# Patient Record
Sex: Male | Born: 1950 | Race: Black or African American | Hispanic: No | Marital: Married | Smoking: Never smoker
Health system: Southern US, Community
[De-identification: ages and names within clinical notes are randomized; demographics above are authoritative.]

## PROBLEM LIST (undated history)

## (undated) HISTORY — PX: CORONARY ANGIOPLASTY: SHX604

## (undated) HISTORY — PX: MULTIPLE TOOTH EXTRACTIONS: SHX2053

## (undated) HISTORY — PX: LUMBAR LAMINECTOMY: SHX95

## (undated) HISTORY — PX: RETINAL DETACHMENT SURGERY: SHX105

## (undated) HISTORY — PX: APPENDECTOMY: SHX54

## (undated) HISTORY — PX: ESOPHAGOGASTRODUODENOSCOPY: SHX1529

## (undated) HISTORY — PX: CATARACT EXTRACTION: SUR2

## (undated) HISTORY — PX: FEMORAL BYPASS: SHX50

## (undated) HISTORY — PX: COLONOSCOPY: SHX174

## (undated) HISTORY — PX: THULIUM LASER TURP (TRANSURETHRAL RESECTION OF PROSTATE): SHX6744

## (undated) HISTORY — PX: GASTRIC BYPASS: SHX52

## (undated) HISTORY — PX: LUMBAR FUSION: SHX111

---
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
## 1980-04-14 HISTORY — PX: CHOLECYSTECTOMY: SHX55

## 2001-10-26 HISTORY — PX: CORONARY ANGIOPLASTY WITH STENT PLACEMENT: SHX49

## 2002-04-05 HISTORY — PX: CORONARY ANGIOPLASTY: SHX604

## 2003-03-23 HISTORY — PX: CORONARY ANGIOPLASTY: SHX604

## 2007-01-07 HISTORY — PX: CORONARY ANGIOPLASTY WITH STENT PLACEMENT: SHX49

## 2018-10-13 HISTORY — PX: BACK SURGERY: SHX140

## 2021-04-01 ENCOUNTER — Other Ambulatory Visit: Payer: Self-pay | Admitting: Neurosurgery

## 2021-04-01 DIAGNOSIS — Z01818 Encounter for other preprocedural examination: Secondary | ICD-10-CM

## 2021-04-04 ENCOUNTER — Other Ambulatory Visit: Payer: Self-pay

## 2021-04-04 ENCOUNTER — Encounter
Admission: RE | Admit: 2021-04-04 | Discharge: 2021-04-04 | Disposition: A | Discharge status: Home / Self Care | Payer: Medicare PPO | Source: Ambulatory Visit | Attending: Neurosurgery | Admitting: Neurosurgery

## 2021-04-04 ENCOUNTER — Encounter (HOSPITAL_COMMUNITY): Payer: Self-pay | Admitting: Urgent Care

## 2021-04-04 VITALS — BP 121/72 | HR 70 | Resp 16 | Ht 68.5 in | Wt 185.2 lb

## 2021-04-04 DIAGNOSIS — Z01818 Encounter for other preprocedural examination: Secondary | ICD-10-CM | POA: Diagnosis not present

## 2021-04-04 DIAGNOSIS — I2510 Atherosclerotic heart disease of native coronary artery without angina pectoris: Secondary | ICD-10-CM | POA: Diagnosis not present

## 2021-04-04 DIAGNOSIS — Z01810 Encounter for preprocedural cardiovascular examination: Secondary | ICD-10-CM | POA: Diagnosis not present

## 2021-04-04 DIAGNOSIS — Z79899 Other long term (current) drug therapy: Secondary | ICD-10-CM | POA: Diagnosis not present

## 2021-04-04 DIAGNOSIS — I10 Essential (primary) hypertension: Secondary | ICD-10-CM | POA: Insufficient documentation

## 2021-04-04 DIAGNOSIS — E119 Type 2 diabetes mellitus without complications: Secondary | ICD-10-CM

## 2021-04-04 DIAGNOSIS — I252 Old myocardial infarction: Secondary | ICD-10-CM | POA: Insufficient documentation

## 2021-04-04 LAB — CBC
HCT: 37 % — ABNORMAL LOW (ref 39.0–52.0)
Hemoglobin: 11.3 g/dL — ABNORMAL LOW (ref 13.0–17.0)
MCH: 24.1 pg — ABNORMAL LOW (ref 26.0–34.0)
MCHC: 30.5 g/dL (ref 30.0–36.0)
MCV: 78.9 fL — ABNORMAL LOW (ref 80.0–100.0)
Platelets: 295 10*3/uL (ref 150–400)
RBC: 4.69 MIL/uL (ref 4.22–5.81)
RDW: 17 % — ABNORMAL HIGH (ref 11.5–15.5)
WBC: 12.3 10*3/uL — ABNORMAL HIGH (ref 4.0–10.5)
nRBC: 0 % (ref 0.0–0.2)

## 2021-04-04 LAB — URINALYSIS, ROUTINE W REFLEX MICROSCOPIC
Bilirubin Urine: NEGATIVE
Glucose, UA: >1000 mg/dL — AB
Ketones, ur: NEGATIVE mg/dL
Leukocytes,Ua: NEGATIVE
Nitrite: NEGATIVE
Protein, ur: NEGATIVE mg/dL
Specific Gravity, Urine: 1.02 (ref 1.005–1.030)
pH: 5.5 (ref 5.0–8.0)

## 2021-04-04 LAB — URINALYSIS, MICROSCOPIC (REFLEX)

## 2021-04-04 LAB — BASIC METABOLIC PANEL
Anion gap: 5 (ref 5–15)
BUN: 19 mg/dL (ref 8–23)
CO2: 27 mmol/L (ref 22–32)
Calcium: 9.1 mg/dL (ref 8.9–10.3)
Chloride: 108 mmol/L (ref 98–111)
Creatinine, Ser: 1.06 mg/dL (ref 0.61–1.24)
GFR, Estimated: >60 mL/min (ref >60)
Glucose, Bld: 90 mg/dL (ref 70–99)
Potassium: 4.1 mmol/L (ref 3.5–5.1)
Sodium: 140 mmol/L (ref 135–145)

## 2021-04-04 LAB — TYPE AND SCREEN
ABO/RH(D): B POS
Antibody Screen: NEGATIVE

## 2021-04-04 LAB — PROTIME-INR
INR: 1 (ref 0.8–1.2)
Prothrombin Time: 12.8 seconds (ref 11.4–15.2)

## 2021-04-04 LAB — APTT: aPTT: 27 seconds (ref 24–36)

## 2021-04-04 LAB — SURGICAL PCR SCREEN
MRSA, PCR: NEGATIVE
Staphylococcus aureus: NEGATIVE

## 2021-04-04 NOTE — Patient Instructions (Addendum)
Your procedure is scheduled on:04-17-21 Wednesday Report to the Registration Desk on the 1st floor of the Medical Mall.Then proceed to the 2nd floor Surgery Desk in the Medical Mall To find out your arrival time, please call [phone] between 1PM - 3PM on:04-16-21 Tuesday  REMEMBER: Instructions that are not followed completely may result in serious medical risk, up to and including death; or upon the discretion of your surgeon and anesthesiologist your surgery may need to be rescheduled.  Do not eat food after midnight the night before surgery.  No gum chewing, lozengers or hard candies.  You may however, drink Water up to 2 hours before you are scheduled to arrive for your surgery. Do not drink anything within 2 hours of your scheduled arrival time.  Type 1 and Type 2 diabetics should only drink water.  TAKE THESE MEDICATIONS THE MORNING OF SURGERY WITH A SIP OF WATER: -buPROPion (WELLBUTRIN XL) -carvedilol (COREG) -gabapentin (NEURONTIN) -mirabegron ER (MYRBETRIQ)  -pantoprazole (PROTONIX)  Stop your clopidogrel (PLAVIX) 7 days prior to surgery as instructed by Dr Jacki Cones dose on 04-09-21 Tuesday  Continue your Aspirin up until the day prior to surgery-Do NOT take the morning of surgery  Stop your empagliflozin (JARDIANCE) 3 days prior to surgery-Last dose on 04-13-21 Saturday  One week prior to surgery: Stop Anti-inflammatories (NSAIDS) such as Advil, Aleve, Ibuprofen, Motrin, Naproxen, Naprosyn and Aspirin based products such as Excedrin, Goodys Powder, BC Powder. You may however, continue to take Tylenol if needed for pain up until the day of surgery.  Stop ANY OVER THE COUNTER supplements/vitamins 7 days prior to surgery (Multiple Vitamin ,Probiotic Product )  No Alcohol for 24 hours before or after surgery.  No Smoking including e-cigarettes for 24 hours prior to surgery.  No chewable tobacco products for at least 6 hours prior to surgery.  No nicotine patches on  the day of surgery.  Do not use any "recreational" drugs for at least a week prior to your surgery.  Please be advised that the combination of cocaine and anesthesia may have negative outcomes, up to and including death. If you test positive for cocaine, your surgery will be cancelled.  On the morning of surgery brush your teeth with toothpaste and water, you may rinse your mouth with mouthwash if you wish. Do not swallow any toothpaste or mouthwash.  Use CHG Soap as directed on instruction sheet.  Do not wear jewelry, make-up, hairpins, clips or nail polish.  Do not wear lotions, powders, or perfumes.   Do not shave body from the neck down 48 hours prior to surgery just in case you cut yourself which could leave a site for infection.  Also, freshly shaved skin may become irritated if using the CHG soap.  Contact lenses, hearing aids and dentures may not be worn into surgery.  Do not bring valuables to the hospital. [facility] is not responsible for any missing/lost belongings or valuables.   Notify your doctor if there is any change in your medical condition (cold, fever, infection).  Wear comfortable clothing (specific to your surgery type) to the hospital.  After surgery, you can help prevent lung complications by doing breathing exercises.  Take deep breaths and cough every 1-2 hours. Your doctor may order a device called an Incentive Spirometer to help you take deep breaths. When coughing or sneezing, hold a pillow firmly against your incision with both hands. This is called splinting. Doing this helps protect your incision. It also decreases belly discomfort.  If  you are being admitted to the hospital overnight, leave your suitcase in the car. After surgery it may be brought to your room.  If you are being discharged the day of surgery, you will not be allowed to drive home. You will need a responsible adult (18 years or older) to drive you home and stay with you that  night.   If you are taking public transportation, you will need to have a responsible adult (18 years or older) with you. Please confirm with your physician that it is acceptable to use public transportation.   Please call the Pre-admissions Testing Dept. at [phone] if you have any questions about these instructions.  Surgery Visitation Policy:  Patients undergoing a surgery or procedure may have one family member or support person with them as long as that person is not COVID-19 positive or experiencing its symptoms.  That person may remain in the waiting area during the procedure and may rotate out with other people.  Inpatient Visitation:    Visiting hours are 7 a.m. to 8 p.m. Up to two visitors ages 16+ are allowed at one time in a patient room. The visitors may rotate out with other people during the day. Visitors must check out when they leave, or other visitors will not be allowed. One designated support person may remain overnight. The visitor must pass COVID-19 screenings, use hand sanitizer when entering and exiting the patients room and wear a mask at all times, including in the patients room. Patients must also wear a mask when staff or their visitor are in the room. Masking is required regardless of vaccination status.

## 2021-04-05 LAB — HEMOGLOBIN A1C
Hgb A1c MFr Bld: 6.7 % — ABNORMAL HIGH (ref 4.8–5.6)
Mean Plasma Glucose: 146 mg/dL

## 2021-04-09 ENCOUNTER — Encounter: Payer: Self-pay | Admitting: Neurosurgery

## 2021-04-09 NOTE — Progress Notes (Signed)
Perioperative Services  Pre-Admission/Anesthesia Testing Clinical Review  Date: 04/09/21  Patient Demographics:  Name: Chad Sanders. 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 Clinical nursing staff has updated patient's PMH/PSHx, current medication list, and drug allergies/intolerances to ensure comprehensive history available to assist in medical decision making as it pertains to the aforementioned surgical procedure and anticipated anesthetic course. Extensive review of available clinical information performed. Chad Sanders PMH and PSHx updated with any diagnoses/procedures that  may have been inadvertently omitted during his intake with the pre-admission testing department's nursing staff.  Clinical Discussion:  Chad Dion. is a 70 y.o. male who is submitted for pre-surgical anesthesia review and clearance prior to him undergoing the above procedure. Patient has never been a smoker. Pertinent PMH includes: CAD, STEMI, PAD, HTN, HLD, T2DM, GERD (on daily PPI), OSAH (does not require nocturnal PAP therapy), anemia, OA, spinal stenosis, thoracolumbar kyphosis, previous lumbar fusion.  Patient is followed by cardiology Chad Shutter, MD). He was last seen in the cardiology clinic on 03/14/2021; notes reviewed.  At the time of his clinic visit, patient doing well overall from a cardiovascular perspective.  He denied any episodes of chest pain, short of breath, PND, orthopnea, palpitations, significant peripheral edema, vertiginous symptoms, or  presyncope/syncope.  Patient did complain of worsening claudication pain in his LEFT lower extremity.  PMH significant for cardiovascular diagnoses.  Patient suffered an inferior wall STEMI on 10/26/2001.  Diagnostic left heart catheterization performed revealing multivessel CAD; 75% mid LCx,, 25% mid LAD-1, 50% mid LAD-2, 50% distal LAD-1, 50% distal LAD-2.  PCI was performed placing a 3.0 x 12 mm Express stent to his mid LCx resulting in TIMI-3 flow.  Patient underwent diagnostic left heart catheterization on 04/05/2002 revealing multivessel CAD; <25% distal RCA, <25% proximal LCx, 50% mid LCx, 25% OM2, less than 25% ramus intermedius, <25% proximal LAD, 50% mid LAD, <25% mid LAD, 50% distal LAD, and 25% D2.  There was 50% ISR noted within the previously placed mid LCx stent.  PTCA was performed.  Patient underwent diagnostic left heart catheterization on 03/23/2003 revealing multivessel CAD; 25% distal RCA, <25% RPL1, 50% mid LCx, 25% OM2, 50% proximal LAD, 25% mid LAD-1, 50% mid LAD-2, and 25% distal LAD.  PTCA was performed.  Patient underwent repeat diagnostic left heart catheterization on 01/07/2007.  EF 60%.  There was 60% stenosis noted within the mid LAD.  PCI performed placing a 2.75 x 20 mm Taxus DES to the mid LAD resulting in TIMI-3 flow.  Last TTE was performed on 04/05/2000 revealing normal left ventricular systolic function with mild LVH; LVEF 50%.  There was trivial TR and PR; no AR/MR.  There was no significant transvalvular gradient to suggest stenosis.  Given PVD and past stent placement, patient is on daily DAPT therapy (ASA + clopidogrel); compliant with therapy with no evidence or reports of GI bleeding.  Blood pressure well controlled at 100/66 on currently prescribed beta-blocker, CCB, and ARB therapies.  Patient is on a statin for his HLD.  T2DM well-controlled on currently prescribed regimen; last Hgb A1c was 6.7% when checked on 04/04/2021.  Patient is on an SGLT2 I in the  setting of known cardiac disease and concurrent T2DM. Functional capacity, as defined by DASI, is documented as being >/= 4 METS.  No changes were made to his medication regimen.  Patient to follow-up with outpatient cardiology and 3 months or sooner if needed.  Chad Bloodgood Sr. is scheduled for an C3-4 ANTERIOR CERVICAL DECOMPRESSION/DISCECTOMY FUSION 1 LEVEL on 04/17/2021 with Dr. Venetia Night, MD.  Given patient's past medical history significant for cardiovascular diagnoses, presurgical cardiac clearance was sought by the PAT team. Per cardiology, "this patient is optimized for surgery and may proceed with the planned procedural course with a LOW risk of significant perioperative cardiovascular complications". Again, this patient is on daily DAPT therapy.  Cardiology has cleared patient to stop his clopidogrel for 5 days prior to his procedure with plans to restart as soon as postoperatively respectively minimized by his primary attending surgeon.  He has been asked to continue his daily low-dose ASA throughout the perioperative period.  He is aware that his last dose of clopidogrel will be on 04/11/2021.  Patient denies previous perioperative complications with anesthesia in the past. In review of the available records, it is noted that patient underwent a general anesthetic course at [facility] (ASA III) in 10/2018 without documented complications.   Vitals with BMI 04/04/2021  Height 5' 8.5"  Weight 185 lbs 3 oz  BMI 27.75  Systolic 121  Diastolic 72  Pulse 70    Providers/Specialists:   NOTE: Primary physician provider listed below. Patient may have been seen by APP or partner within same practice.   PROVIDER ROLE / SPECIALTY LAST Donalynn Furlong, MD Neurosurgery 03/23/2021  Ferne Coe, MD Primary Care Provider  ???  Youlanda Mighty, MD Cardiology 03/14/2021   Allergies:  Ace inhibitors, Atorvastatin, Contrast media [iodinated contrast media],  Demerol [meperidine hcl], Dilaudid [hydromorphone], Latex, Morphine and related, Nsaids, Silicone, Tape, and Vytorin [ezetimibe-simvastatin]  Current Home Medications:   No current facility-administered medications for this encounter.    acetaminophen (TYLENOL) 500 MG tablet   aspirin EC 81 MG tablet   buPROPion (WELLBUTRIN XL) 150 MG 24 hr tablet   carvedilol (COREG) 25 MG tablet   clopidogrel (PLAVIX) 75 MG tablet   Doxepin HCl 6 MG TABS   empagliflozin (JARDIANCE) 25 MG TABS tablet   felodipine (PLENDIL) 10 MG 24 hr tablet   furosemide (LASIX) 20 MG tablet   gabapentin (NEURONTIN) 600 MG tablet   hydrOXYzine (ATARAX) 25 MG tablet   loperamide (IMODIUM A-D) 2 MG tablet   mirabegron ER (MYRBETRIQ) 50 MG TB24 tablet   pantoprazole (PROTONIX) 40 MG tablet   potassium chloride SA (KLOR-CON M) 20 MEQ tablet   pravastatin (PRAVACHOL) 40 MG tablet   Probiotic Product (DIGESTIVE ADVANTAGE) CAPS   sodium fluoride (PREVIDENT 5000 PLUS) 1.1 % CREA dental cream   tamsulosin (FLOMAX) 0.4 MG CAPS capsule   telmisartan (MICARDIS) 80 MG tablet   calcium carbonate (TUMS - DOSED IN MG ELEMENTAL CALCIUM) 500 MG chewable tablet   Multiple Vitamins-Minerals (MULTIVITAMIN MEN 50+ PO)   Probiotic Product (DIGESTIVE ADVANTAGE GUMMIES PO)   sitaGLIPtin (JANUVIA) 50 MG tablet   History:   Past Medical History:  Diagnosis Date   Anemia    Arthritis    Coronary artery disease 10/26/2001   a.) STEMI 10/26/2001. LHC --> 75% mLCx, 25% mLAD, 50%  mLAD, 50% dLAD; 3.0 x 12 mm Exress stent to mLCx. b.) LHC 04/05/2002: 50% mLCx, 50% mLAD, 50% dLAD, 50% ISR of mLCX stent; PCTA performed. c.) LHC 03/23/2003: 50% mLCx, 50% pLAD, 50% mLAD; PTCA performed. d.) LHC 01/07/2007: 60% mLAD; PCI placing a 2.75 x 20 mm Taxus DES to mLAD.   Erectile dysfunction    GERD (gastroesophageal reflux disease)    H/O heart artery stent 10/26/2001   a.) 10/26/2001: 3.0 x 12 mm Express stent to mLCx. b.) 01/07/2007: 2.75 x 20 mm  Taxus DES to mLAD.   High cholesterol    History of lumbar fusion    Hypertension    Long term current use of antithrombotics/antiplatelets    a.) DAPT therapy (ASA + clopidogrel)   OAB (overactive bladder)    PAD (peripheral artery disease) (HCC)    Prostate cancer (HCC)    a.) Tx'd with chemotherapy + XRT   S/P gastric bypass    Sleep apnea    a.) no nocturnal PAP therapy following post-surgical (RNY) weight loss   Spinal stenosis    ST elevation myocardial infarction (STEMI) of inferior wall (HCC) 10/26/2001   a.) LHC 10/26/2001: EF 55%; 75% mLCx, 25% mLAD, 50% mLAD, 50% dLAD; PCI performed placing a 3.0 x 12 mm Extress stent to mLCx.   T2DM (type 2 diabetes mellitus) (HCC)    Thoracolumbar kyphosis    Past Surgical History:  Procedure Laterality Date   APPENDECTOMY     child   CATARACT EXTRACTION Bilateral    CHOLECYSTECTOMY  1982   COLONOSCOPY     CORONARY ANGIOPLASTY     CORONARY ANGIOPLASTY Left 04/05/2002   Procedure: CORONARY ANGIOPLASTY WITH PTCA; Location: [facility]; Surgeon: Cephas Darby, MD   CORONARY ANGIOPLASTY Left 03/23/2003   Procedure: CORONARY ANGIOPLASTY WITH PTCA; Location: [facility]; Surgeon: Cephas Darby, MD   CORONARY ANGIOPLASTY WITH STENT PLACEMENT Left 10/26/2001   Procedure: CORONARY ANGIOPLASTY WITH STENT PLACEMENT (3.0 x 12 mm Express stent to mLCx); Location: [facility]; Surgeon: Jerrell Mylar, MD   CORONARY ANGIOPLASTY WITH STENT PLACEMENT Left 01/07/2007   Procedure: CORONARY ANGIOPLASTY WITH STENT PLACEMENT (2.75 x 20 mm Taxus DES to mLAD); Location: [facility]; Surgeon: Rolly Salter, MD   ESOPHAGOGASTRODUODENOSCOPY     FEMORAL BYPASS Left    FRACTURE SURGERY Right 1966   side of skull   GASTRIC BYPASS     LUMBAR FUSION     LUMBAR LAMINECTOMY     muliple lumbar surgeries   MULTIPLE TOOTH EXTRACTIONS     RETINAL DETACHMENT SURGERY Right    THULIUM LASER TURP (TRANSURETHRAL RESECTION OF PROSTATE)     No family history on file. Social History   Tobacco Use    Smoking status: Never   Smokeless tobacco: Never  Substance Use Topics   Alcohol use: Not Currently    Comment: if so its very rare   Drug use: Never    Pertinent Clinical Results:  LABS: Labs reviewed: Acceptable for surgery.  No visits with results within 3 Day(s) from this visit.  Latest known visit with results is:  Hospital Outpatient Visit on 04/04/2021  Component Date Value Ref Range Status   aPTT 04/04/2021 27  24 - 36 seconds Final   Performed at [facility], [address] [place], [place] [address]   Sodium 04/04/2021 140  135 - 145 mmol/L Final   Potassium 04/04/2021 4.1  3.5 - 5.1 mmol/L Final   Chloride 04/04/2021 108  98 - 111 mmol/L Final   CO2  04/04/2021 27  22 - 32 mmol/L Final   Glucose, Bld 04/04/2021 90  70 - 99 mg/dL Final   Glucose reference range applies only to samples taken after fasting for at least 8 hours.   BUN 04/04/2021 19  8 - 23 mg/dL Final   Creatinine, Ser 04/04/2021 1.06  0.61 - 1.24 mg/dL Final   Calcium 24/40/1027 9.1  8.9 - 10.3 mg/dL Final   GFR, Estimated 04/04/2021 >60  >60 mL/min Final   Comment: (NOTE) Calculated using the CKD-EPI Creatinine Equation (2021)    Anion gap 04/04/2021 5  5 - 15 Final   Performed at [facility], [address] [place], [place] [address]   WBC 04/04/2021 12.3 (H)  4.0 - 10.5 K/uL Final   RBC 04/04/2021 4.69  4.22 - 5.81 MIL/uL Final   Hemoglobin 04/04/2021 11.3 (L)  13.0 - 17.0 g/dL Final   HCT 44/06/4740 37.0 (L)  39.0 - 52.0 % Final   MCV 04/04/2021 78.9 (L)  80.0 - 100.0 fL Final   MCH 04/04/2021 24.1 (L)  26.0 - 34.0 pg Final   MCHC 04/04/2021 30.5  30.0 - 36.0 g/dL Final   RDW 59/56/3875 17.0 (H)  11.5 - 15.5 % Final   Platelets 04/04/2021 295  150 - 400 K/uL Final   nRBC 04/04/2021 0.0  0.0 - 0.2 % Final   Performed at [facility], [address] [place], [place] [address]   MRSA, PCR 04/04/2021 NEGATIVE  NEGATIVE Final   Staphylococcus aureus 04/04/2021  NEGATIVE  NEGATIVE Final   Comment: (NOTE) The Xpert SA Assay (FDA approved for NASAL specimens in patients 1 years of age and older), is one component of a comprehensive surveillance program. It is not intended to diagnose infection nor to guide or monitor treatment. Performed at [facility], [address] [place], [place] [address]   ABO/RH(D) 04/04/2021 B POS   Final   Antibody Screen 04/04/2021 NEG   Final   Sample Expiration 04/04/2021 04/18/2021,2359   Final   Extend sample reason 04/04/2021    Final                   Value:NO TRANSFUSIONS OR PREGNANCY IN THE PAST 3 MONTHS Performed at [facility], [address] [place], [place] [address]   Color, Urine 04/04/2021 YELLOW  YELLOW Final   APPearance 04/04/2021 CLEAR  CLEAR Final   Specific Gravity, Urine 04/04/2021 1.020  1.005 - 1.030 Final   pH 04/04/2021 5.5  5.0 - 8.0 Final   Glucose, UA 04/04/2021 >1,000 (A)  NEGATIVE mg/dL Final   Hgb urine dipstick 04/04/2021 TRACE (A)  NEGATIVE Final   Bilirubin Urine 04/04/2021 NEGATIVE  NEGATIVE Final   Ketones, ur 04/04/2021 NEGATIVE  NEGATIVE mg/dL Final   Protein, ur 63/04/6008 NEGATIVE  NEGATIVE mg/dL Final   Nitrite 93/23/5573 NEGATIVE  NEGATIVE Final   Leukocytes,Ua 04/04/2021 NEGATIVE  NEGATIVE Final   Performed at [facility], [address] [place], [place] [address]   Prothrombin Time 04/04/2021 12.8  11.4 - 15.2 seconds Final   INR 04/04/2021 1.0  0.8 - 1.2 Final   Comment: (NOTE) INR goal varies based on device and disease states. Performed at [facility], [address] [place], [place] [address]   Hgb A1c MFr Bld 04/04/2021 6.7 (H)  4.8 - 5.6 % Final   Comment: (NOTE)         Prediabetes: 5.7 - 6.4  Diabetes: >6.4         Glycemic control for adults with diabetes: <7.0   Mean Plasma Glucose 04/04/2021 146  mg/dL Final   Comment: (NOTE) Performed At: [facility] [address] [place], [place]  [id number] Jolene Schimke MD WU:[id number]   RBC / HPF 04/04/2021 0-5  0 - 5 RBC/hpf Final   WBC, UA 04/04/2021 0-5  0 - 5 WBC/hpf Final   Bacteria, UA 04/04/2021 RARE (A)  NONE SEEN Final   Squamous Epithelial / LPF 04/04/2021 0-5  0 - 5 Final   Performed at [facility], [address] [place], [place] [address]    ECG: Date: 04/04/2021 Time ECG obtained: 1401 PM Rate: 79 bpm Rhythm: normal sinus Axis (leads I and aVF): Normal Intervals: PR 184 ms. QRS 80 ms. QTc 399 ms. ST segment and T wave changes: No evidence of acute ST segment elevation or depression Comparison: Similar to previous tracing obtained on 08/31/2020   IMAGING / PROCEDURES: MRI TOTAL TIME INCLUDING C/T/L SPINE WITH/WITHOUT CONTRAST performed on 03/25/2021 Disc bulge, facet arthropathy and ligamentum flavum buckling at T8-T9 results in severe spinal canal stenosis and severe bilateral neuroforaminal narrowing.  Spondylosis of the cervical spine most pronounced at C3-C4 where there is uncovertebral hypertrophy and small central disc protrusion resulting in moderate spinal canal stenosis.   TRANSTHORACIC ECHOCARDIOGRAM performed on 04/05/2020 LVEF 50% Normal left ventricular systolic function with mild LVH Normal right ventricular systolic function Trivial TR and PR No AR or MR No valvular stenosis No pericardial effusion  Impression and Plan:  Chad Bloodgood Sr. has been referred for pre-anesthesia review and clearance prior to him undergoing the planned anesthetic and procedural courses. Available labs, pertinent testing, and imaging results were personally reviewed by me. This patient has been appropriately cleared by cardiology with an overall LOW risk of significant perioperative cardiovascular complications.  Based on clinical review performed today (04/09/21), barring any significant acute changes in the patient's overall condition, it is anticipated that he will be able to proceed with the  planned surgical intervention. Any acute changes in clinical condition may necessitate his procedure being postponed and/or cancelled. Patient will meet with anesthesia team (MD and/or CRNA) on the day of his procedure for preoperative evaluation/assessment. Questions regarding anesthetic course will be fielded at that time.   Pre-surgical instructions were reviewed with the patient during his PAT appointment and questions were fielded by PAT clinical staff. Patient was advised that if any questions or concerns arise prior to his procedure then he should return a call to PAT and/or his surgeon's office to discuss.  Quentin Mulling, MSN, APRN, FNP-C, CEN [facility]  Peri-operative Services Nurse Practitioner Phone: [phone] Fax: [phone] 04/09/21 1:05 PM  NOTE: This note has been prepared using Dragon dictation software. Despite my best ability to proofread, there is always the potential that unintentional transcriptional errors may still occur from this process.

## 2021-04-11 NOTE — Progress Notes (Signed)
I don't understand all these coding queries.  This is a standard preop lab.  [employer]

## 2021-04-17 ENCOUNTER — Ambulatory Visit: Admission: RE | Admit: 2021-04-17 | Payer: Medicare PPO | Source: Home / Self Care | Admitting: Neurosurgery

## 2021-04-17 ENCOUNTER — Encounter: Admission: RE | Payer: Self-pay | Source: Home / Self Care

## 2021-04-17 SURGERY — ANTERIOR CERVICAL DECOMPRESSION/DISCECTOMY FUSION 1 LEVEL
Anesthesia: General

## 2021-04-29 ENCOUNTER — Other Ambulatory Visit: Payer: Self-pay | Admitting: Neurosurgery

## 2021-04-29 DIAGNOSIS — Z01818 Encounter for other preprocedural examination: Secondary | ICD-10-CM

## 2021-05-20 ENCOUNTER — Other Ambulatory Visit: Payer: Self-pay

## 2021-05-20 ENCOUNTER — Other Ambulatory Visit
Admission: RE | Admit: 2021-05-20 | Discharge: 2021-05-20 | Disposition: A | Discharge status: Home / Self Care | Payer: Medicare PPO | Source: Ambulatory Visit | Attending: Neurosurgery | Admitting: Neurosurgery

## 2021-05-20 NOTE — Patient Instructions (Signed)
Your procedure is scheduled on: Wednesday May 29, 2021. Report to Day Surgery inside Medical 2nd floor,  To find out your arrival time please call [phone] between 1PM - 3PM on .  Remember: Instructions that are not followed completely may result in serious medical risk,  up to and including death, or upon the discretion of your surgeon and anesthesiologist your  surgery may need to be rescheduled.     _X__ 1. Do not eat food after midnight the night before your procedure.                 No chewing gum or hard candies. You may drink clear liquids up to 2 hours                 before you are scheduled to arrive for your surgery- DO not drink clear                 liquids within 2 hours of the start of your surgery.                 Clear Liquids include:  water.  __X__2.  On the morning of surgery brush your teeth with toothpaste and water, you                may rinse your mouth with mouthwash if you wish.  Do not swallow any toothpaste or mouthwash.     _X__ 3.  No Alcohol for 24 hours before or after surgery.   _X__ 4.  Do Not Smoke or use e-cigarettes For 24 Hours Prior to Your Surgery.                 Do not use any chewable tobacco products for at least 6 hours prior to                 Surgery.  _X__  5.  Do not use any recreational drugs (marijuana, cocaine, heroin, ecstasy, MDMA or other)                For at least one week prior to your surgery.  Combination of these drugs with anesthesia                May have life threatening results.  ____  6.  Bring all medications with you on the day of surgery if instructed.   __X__  7.  Notify your doctor if there is any change in your medical condition      (cold, fever, infections).     Do not wear jewelry, make-up, hairpins, clips or nail polish. Do not wear lotions, powders, or perfumes. You may wear deodorant. Do not shave 48 hours prior to surgery. Men may shave face and neck. Do not bring  valuables to the hospital.    [facility] is not responsible for any belongings or valuables.  Contacts, dentures or bridgework may not be worn into surgery. Leave your suitcase in the car. After surgery it may be brought to your room. For patients admitted to the hospital, discharge time is determined by your treatment team.   Patients discharged the day of surgery will not be allowed to drive home.   Make arrangements for someone to be with you for the first 24 hours of your Same Day Discharge.   __X__ Take these medicines the morning of surgery with A SIP OF WATER:    1. buPROPion (WELLBUTRIN XL) 150 MG   2. carvedilol (COREG)  25 MG  3. gabapentin (NEURONTIN) 600 MG   4. mirabegron ER (MYRBETRIQ) 50 MG  5. pantoprazole (PROTONIX) 40 MG  6.  ____ Fleet Enema (as directed)   __X__ Use CHG Soap (or wipes) as directed  ____ Use Benzoyl Peroxide Gel as instructed  ____ Use inhalers on the day of surgery  ____ Stop metformin 2 days prior to surgery    ____ Take 1/2 of usual insulin dose the night before surgery. No insulin the morning          of surgery.   __X__ Stop clopidogrel (PLAVIX) 75 MG 7 days befor your surgery as instructed by your doctor..   __X__ One Week prior to surgery- Stop Anti-inflammatories such as Ibuprofen, Aleve, Advil, Motrin, meloxicam (MOBIC), diclofenac, etodolac, ketorolac, Toradol, Daypro, piroxicam, Goody's or BC powders. OK TO USE TYLENOL IF NEEDED   __X__ Stop supplements until after surgery.    ____ Bring C-Pap to the hospital.    If you have any questions regarding your pre-procedure instructions,  Please call Pre-admit Testing at [phone]

## 2021-05-29 ENCOUNTER — Ambulatory Visit: Payer: Medicare PPO | Admitting: Anesthesiology

## 2021-05-29 ENCOUNTER — Other Ambulatory Visit: Payer: Self-pay

## 2021-05-29 ENCOUNTER — Encounter
Admission: RE | Disposition: A | Discharge status: Home / Self Care | Payer: Self-pay | Source: Home / Self Care | Attending: Neurosurgery

## 2021-05-29 ENCOUNTER — Ambulatory Visit
Admission: RE | Admit: 2021-05-29 | Discharge: 2021-05-29 | Disposition: A | Discharge status: Home / Self Care | Payer: Medicare PPO | Attending: Neurosurgery | Admitting: Neurosurgery

## 2021-05-29 ENCOUNTER — Ambulatory Visit: Payer: Medicare PPO

## 2021-05-29 ENCOUNTER — Encounter: Payer: Self-pay | Admitting: Neurosurgery

## 2021-05-29 DIAGNOSIS — D649 Anemia, unspecified: Secondary | ICD-10-CM | POA: Diagnosis not present

## 2021-05-29 DIAGNOSIS — Z955 Presence of coronary angioplasty implant and graft: Secondary | ICD-10-CM | POA: Insufficient documentation

## 2021-05-29 DIAGNOSIS — D759 Disease of blood and blood-forming organs, unspecified: Secondary | ICD-10-CM | POA: Insufficient documentation

## 2021-05-29 DIAGNOSIS — R32 Unspecified urinary incontinence: Secondary | ICD-10-CM | POA: Diagnosis not present

## 2021-05-29 DIAGNOSIS — I252 Old myocardial infarction: Secondary | ICD-10-CM | POA: Diagnosis not present

## 2021-05-29 DIAGNOSIS — I10 Essential (primary) hypertension: Secondary | ICD-10-CM | POA: Diagnosis not present

## 2021-05-29 DIAGNOSIS — M75101 Unspecified rotator cuff tear or rupture of right shoulder, not specified as traumatic: Secondary | ICD-10-CM | POA: Insufficient documentation

## 2021-05-29 DIAGNOSIS — E119 Type 2 diabetes mellitus without complications: Secondary | ICD-10-CM | POA: Diagnosis not present

## 2021-05-29 DIAGNOSIS — Z7902 Long term (current) use of antithrombotics/antiplatelets: Secondary | ICD-10-CM | POA: Insufficient documentation

## 2021-05-29 DIAGNOSIS — M2578 Osteophyte, vertebrae: Secondary | ICD-10-CM | POA: Insufficient documentation

## 2021-05-29 DIAGNOSIS — Z981 Arthrodesis status: Secondary | ICD-10-CM | POA: Insufficient documentation

## 2021-05-29 DIAGNOSIS — Z419 Encounter for procedure for purposes other than remedying health state, unspecified: Secondary | ICD-10-CM

## 2021-05-29 DIAGNOSIS — M4312 Spondylolisthesis, cervical region: Secondary | ICD-10-CM | POA: Diagnosis present

## 2021-05-29 DIAGNOSIS — Z9181 History of falling: Secondary | ICD-10-CM | POA: Insufficient documentation

## 2021-05-29 DIAGNOSIS — Z79899 Other long term (current) drug therapy: Secondary | ICD-10-CM | POA: Insufficient documentation

## 2021-05-29 DIAGNOSIS — G4730 Sleep apnea, unspecified: Secondary | ICD-10-CM | POA: Diagnosis not present

## 2021-05-29 DIAGNOSIS — G992 Myelopathy in diseases classified elsewhere: Secondary | ICD-10-CM | POA: Insufficient documentation

## 2021-05-29 DIAGNOSIS — I2510 Atherosclerotic heart disease of native coronary artery without angina pectoris: Secondary | ICD-10-CM | POA: Insufficient documentation

## 2021-05-29 DIAGNOSIS — R3915 Urgency of urination: Secondary | ICD-10-CM | POA: Diagnosis not present

## 2021-05-29 DIAGNOSIS — M4804 Spinal stenosis, thoracic region: Secondary | ICD-10-CM | POA: Diagnosis not present

## 2021-05-29 DIAGNOSIS — R159 Full incontinence of feces: Secondary | ICD-10-CM | POA: Diagnosis not present

## 2021-05-29 HISTORY — PX: ANTERIOR CERVICAL DECOMP/DISCECTOMY FUSION: SHX1161

## 2021-05-29 LAB — CBC
HCT: 36.8 % — ABNORMAL LOW (ref 39.0–52.0)
Hemoglobin: 11.1 g/dL — ABNORMAL LOW (ref 13.0–17.0)
MCH: 23.7 pg — ABNORMAL LOW (ref 26.0–34.0)
MCHC: 30.2 g/dL (ref 30.0–36.0)
MCV: 78.5 fL — ABNORMAL LOW (ref 80.0–100.0)
Platelets: 275 10*3/uL (ref 150–400)
RBC: 4.69 MIL/uL (ref 4.22–5.81)
RDW: 18.6 % — ABNORMAL HIGH (ref 11.5–15.5)
WBC: 6.9 10*3/uL (ref 4.0–10.5)
nRBC: 0 % (ref 0.0–0.2)

## 2021-05-29 LAB — TYPE AND SCREEN
ABO/RH(D): B POS
Antibody Screen: NEGATIVE

## 2021-05-29 LAB — GLUCOSE, CAPILLARY
Glucose-Capillary: 120 mg/dL — ABNORMAL HIGH (ref 70–99)
Glucose-Capillary: 117 mg/dL — ABNORMAL HIGH (ref 70–99)

## 2021-05-29 SURGERY — ANTERIOR CERVICAL DECOMPRESSION/DISCECTOMY FUSION 1 LEVEL
Anesthesia: General | Site: Spine Cervical

## 2021-05-29 MED ORDER — GLYCOPYRROLATE 0.2 MG/ML IJ SOLN
INTRAMUSCULAR | Status: AC
  Filled 2021-05-29: qty 1

## 2021-05-29 MED ORDER — FENTANYL CITRATE (PF) 100 MCG/2ML IJ SOLN
INTRAMUSCULAR | Status: AC
  Administered 2021-05-29: 25 ug via INTRAVENOUS
  Filled 2021-05-29: qty 2

## 2021-05-29 MED ORDER — METHOCARBAMOL 500 MG PO TABS
ORAL_TABLET | ORAL | Status: AC
  Filled 2021-05-29: qty 1

## 2021-05-29 MED ORDER — FENTANYL CITRATE (PF) 100 MCG/2ML IJ SOLN
25.0000 ug | INTRAMUSCULAR | Status: DC | PRN
  Administered 2021-05-29 (×3): 25 ug via INTRAVENOUS

## 2021-05-29 MED ORDER — MIDAZOLAM HCL 2 MG/2ML IJ SOLN
INTRAMUSCULAR | Status: DC | PRN
  Administered 2021-05-29: 2 mg via INTRAVENOUS

## 2021-05-29 MED ORDER — SODIUM CHLORIDE 0.9 % IV SOLN: INTRAVENOUS | Status: DC

## 2021-05-29 MED ORDER — ONDANSETRON HCL 4 MG/2ML IJ SOLN
INTRAMUSCULAR | Status: AC
  Filled 2021-05-29: qty 2

## 2021-05-29 MED ORDER — PHENYLEPHRINE HCL (PRESSORS) 10 MG/ML IV SOLN
INTRAVENOUS | Status: DC | PRN
  Administered 2021-05-29 (×2): 100 ug via INTRAVENOUS

## 2021-05-29 MED ORDER — PHENYLEPHRINE HCL-NACL 20-0.9 MG/250ML-% IV SOLN
INTRAVENOUS | Status: AC
  Filled 2021-05-29: qty 250

## 2021-05-29 MED ORDER — EPHEDRINE 5 MG/ML INJ
INTRAVENOUS | Status: AC
  Filled 2021-05-29: qty 5

## 2021-05-29 MED ORDER — ONDANSETRON HCL 4 MG/2ML IJ SOLN
INTRAMUSCULAR | Status: DC | PRN
Start: 2021-05-29 — End: 2021-05-29
  Administered 2021-05-29: 4 mg via INTRAVENOUS

## 2021-05-29 MED ORDER — FENTANYL CITRATE (PF) 100 MCG/2ML IJ SOLN
INTRAMUSCULAR | Status: DC | PRN
  Administered 2021-05-29 (×4): 25 ug via INTRAVENOUS

## 2021-05-29 MED ORDER — ONDANSETRON HCL 4 MG/2ML IJ SOLN: 4.0000 mg | Freq: Once | INTRAMUSCULAR | Status: DC | PRN

## 2021-05-29 MED ORDER — BUPIVACAINE-EPINEPHRINE (PF) 0.5% -1:200000 IJ SOLN
INTRAMUSCULAR | Status: DC | PRN
  Administered 2021-05-29: 5 mL via PERINEURAL

## 2021-05-29 MED ORDER — METHOCARBAMOL 500 MG PO TABS
500.0000 mg | ORAL_TABLET | Freq: Three times a day (TID) | ORAL | Status: DC | PRN
  Administered 2021-05-29: 500 mg via ORAL

## 2021-05-29 MED ORDER — PROPOFOL 10 MG/ML IV BOLUS
INTRAVENOUS | Status: DC | PRN
Start: 2021-05-29 — End: 2021-05-29
  Administered 2021-05-29: 125 ug/kg/min via INTRAVENOUS

## 2021-05-29 MED ORDER — OXYCODONE-ACETAMINOPHEN 5-325 MG PO TABS: 1.0000 | ORAL_TABLET | ORAL | 0 refills | Status: AC | PRN

## 2021-05-29 MED ORDER — KETAMINE HCL 50 MG/5ML IJ SOSY
PREFILLED_SYRINGE | INTRAMUSCULAR | Status: AC
  Filled 2021-05-29: qty 5

## 2021-05-29 MED ORDER — SUCCINYLCHOLINE CHLORIDE 200 MG/10ML IV SOSY
PREFILLED_SYRINGE | INTRAVENOUS | Status: AC
  Filled 2021-05-29: qty 10

## 2021-05-29 MED ORDER — KETAMINE HCL 10 MG/ML IJ SOLN
INTRAMUSCULAR | Status: DC | PRN
Start: 2021-05-29 — End: 2021-05-29
  Administered 2021-05-29: 30 mg via INTRAVENOUS
  Administered 2021-05-29 (×2): 10 mg via INTRAVENOUS

## 2021-05-29 MED ORDER — LIDOCAINE HCL (CARDIAC) PF 100 MG/5ML IV SOSY
PREFILLED_SYRINGE | INTRAVENOUS | Status: DC | PRN
  Administered 2021-05-29: 100 mg via INTRAVENOUS

## 2021-05-29 MED ORDER — DEXAMETHASONE SODIUM PHOSPHATE 10 MG/ML IJ SOLN
INTRAMUSCULAR | Status: AC
  Filled 2021-05-29: qty 1

## 2021-05-29 MED ORDER — REMIFENTANIL HCL 1 MG IV SOLR
INTRAVENOUS | Status: DC | PRN
  Administered 2021-05-29: .05 ug/kg/min via INTRAVENOUS

## 2021-05-29 MED ORDER — CHLORHEXIDINE GLUCONATE 0.12 % MT SOLN: 15.0000 mL | Freq: Once | OROMUCOSAL | Status: AC

## 2021-05-29 MED ORDER — SURGIFLO WITH THROMBIN (HEMOSTATIC MATRIX KIT) OPTIME
TOPICAL | Status: DC | PRN
  Administered 2021-05-29: 1 via TOPICAL

## 2021-05-29 MED ORDER — MIDAZOLAM HCL 2 MG/2ML IJ SOLN
INTRAMUSCULAR | Status: AC
  Filled 2021-05-29: qty 2

## 2021-05-29 MED ORDER — PROPOFOL 1000 MG/100ML IV EMUL
INTRAVENOUS | Status: AC
  Filled 2021-05-29: qty 100

## 2021-05-29 MED ORDER — ORAL CARE MOUTH RINSE: 15.0000 mL | Freq: Once | OROMUCOSAL | Status: AC

## 2021-05-29 MED ORDER — GLYCOPYRROLATE 0.2 MG/ML IJ SOLN
INTRAMUSCULAR | Status: DC | PRN
  Administered 2021-05-29: .1 mg via INTRAVENOUS

## 2021-05-29 MED ORDER — EPHEDRINE SULFATE (PRESSORS) 50 MG/ML IJ SOLN
INTRAMUSCULAR | Status: DC | PRN
  Administered 2021-05-29: 10 mg via INTRAVENOUS

## 2021-05-29 MED ORDER — CEFAZOLIN SODIUM-DEXTROSE 2-4 GM/100ML-% IV SOLN
INTRAVENOUS | Status: AC
  Filled 2021-05-29: qty 100

## 2021-05-29 MED ORDER — OXYCODONE-ACETAMINOPHEN 5-325 MG PO TABS
1.0000 | ORAL_TABLET | Freq: Once | ORAL | Status: AC
  Administered 2021-05-29: 1 via ORAL

## 2021-05-29 MED ORDER — FENTANYL CITRATE (PF) 100 MCG/2ML IJ SOLN
INTRAMUSCULAR | Status: AC
  Filled 2021-05-29: qty 2

## 2021-05-29 MED ORDER — SUCCINYLCHOLINE CHLORIDE 200 MG/10ML IV SOSY
PREFILLED_SYRINGE | INTRAVENOUS | Status: DC | PRN
  Administered 2021-05-29: 100 mg via INTRAVENOUS

## 2021-05-29 MED ORDER — CHLORHEXIDINE GLUCONATE 0.12 % MT SOLN
OROMUCOSAL | Status: AC
  Administered 2021-05-29: 15 mL via OROMUCOSAL
  Filled 2021-05-29: qty 15

## 2021-05-29 MED ORDER — BUPIVACAINE-EPINEPHRINE (PF) 0.5% -1:200000 IJ SOLN
INTRAMUSCULAR | Status: AC
  Filled 2021-05-29: qty 30

## 2021-05-29 MED ORDER — REMIFENTANIL HCL 1 MG IV SOLR
INTRAVENOUS | Status: AC
  Filled 2021-05-29: qty 1000

## 2021-05-29 MED ORDER — METHOCARBAMOL 500 MG PO TABS
500.0000 mg | ORAL_TABLET | Freq: Three times a day (TID) | ORAL | 0 refills | Status: AC | PRN
Start: 2021-05-29 — End: ?

## 2021-05-29 MED ORDER — OXYCODONE-ACETAMINOPHEN 5-325 MG PO TABS
ORAL_TABLET | ORAL | Status: AC
  Filled 2021-05-29: qty 1

## 2021-05-29 MED ORDER — SODIUM CHLORIDE (PF) 0.9 % IJ SOLN
INTRAMUSCULAR | Status: AC
  Filled 2021-05-29: qty 20

## 2021-05-29 MED ORDER — DEXAMETHASONE SODIUM PHOSPHATE 10 MG/ML IJ SOLN
INTRAMUSCULAR | Status: DC | PRN
  Administered 2021-05-29: 10 mg via INTRAVENOUS

## 2021-05-29 MED ORDER — 0.9 % SODIUM CHLORIDE (POUR BTL) OPTIME
TOPICAL | Status: DC | PRN
  Administered 2021-05-29: 100 mL

## 2021-05-29 MED ORDER — CEFAZOLIN SODIUM-DEXTROSE 2-4 GM/100ML-% IV SOLN
2.0000 g | INTRAVENOUS | Status: AC
  Administered 2021-05-29: 2 g via INTRAVENOUS

## 2021-05-29 MED ORDER — PHENYLEPHRINE HCL-NACL 20-0.9 MG/250ML-% IV SOLN
INTRAVENOUS | Status: DC | PRN
Start: 2021-05-29 — End: 2021-05-29
  Administered 2021-05-29: 100 ug/min via INTRAVENOUS

## 2021-05-29 SURGICAL SUPPLY — 64 items
ADH SKN CLS APL DERMABOND .7 (GAUZE/BANDAGES/DRESSINGS) ×1
AGENT HMST KT MTR STRL THRMB (HEMOSTASIS) ×1
APL PRP STRL LF DISP 70% ISPRP (MISCELLANEOUS) ×2
BASKET BONE COLLECTION (BASKET) IMPLANT
BULB RESERV EVAC DRAIN JP 100C (MISCELLANEOUS) IMPLANT
BUR NEURO DRILL SOFT 3.0X3.8M (BURR) ×2 IMPLANT
CHLORAPREP W/TINT 26 (MISCELLANEOUS) ×4 IMPLANT
COUNTER NEEDLE 20/40 LG (NEEDLE) ×2 IMPLANT
CUP MEDICINE 2OZ PLAST GRAD ST (MISCELLANEOUS) ×2 IMPLANT
DERMABOND ADVANCED (GAUZE/BANDAGES/DRESSINGS) ×1
DERMABOND ADVANCED .7 DNX12 (GAUZE/BANDAGES/DRESSINGS) ×1 IMPLANT
DRAIN CHANNEL JP 10F RND 20C F (MISCELLANEOUS) IMPLANT
DRAPE C ARM PK CFD 31 SPINE (DRAPES) ×2 IMPLANT
DRAPE LAPAROTOMY 77X122 PED (DRAPES) ×2 IMPLANT
DRAPE MICROSCOPE SPINE 48X150 (DRAPES) ×2 IMPLANT
DRAPE SURG 17X11 SM STRL (DRAPES) ×5 IMPLANT
ELECT CAUTERY BLADE TIP 2.5 (TIP) ×2
ELECT REM PT RETURN 9FT ADLT (ELECTROSURGICAL) ×2
ELECTRODE CAUTERY BLDE TIP 2.5 (TIP) ×1 IMPLANT
ELECTRODE REM PT RTRN 9FT ADLT (ELECTROSURGICAL) ×1 IMPLANT
FEE INTRAOP CADWELL SUPPLY NCS (MISCELLANEOUS) ×1 IMPLANT
FEE INTRAOP MONITOR IMPULS NCS (MISCELLANEOUS) IMPLANT
GAUZE 4X4 16PLY ~~LOC~~+RFID DBL (SPONGE) ×2 IMPLANT
GLOVE SURG SYN 6.5 ES PF (GLOVE) ×2 IMPLANT
GLOVE SURG SYN 6.5 PF PI (GLOVE) ×1 IMPLANT
GLOVE SURG SYN 8.5  E (GLOVE) ×3
GLOVE SURG SYN 8.5 E (GLOVE) ×3 IMPLANT
GLOVE SURG SYN 8.5 PF PI (GLOVE) ×3 IMPLANT
GLOVE SURG UNDER POLY LF SZ6.5 (GLOVE) ×2 IMPLANT
GOWN SRG LRG LVL 4 IMPRV REINF (GOWNS) ×1 IMPLANT
GOWN SRG XL LVL 3 NONREINFORCE (GOWNS) ×1 IMPLANT
GOWN STRL NON-REIN TWL XL LVL3 (GOWNS) ×2
GOWN STRL REIN LRG LVL4 (GOWNS) ×2
GRADUATE 1200CC STRL 31836 (MISCELLANEOUS) ×2 IMPLANT
GRAFT DURAGEN MATRIX 1WX1L (Tissue) IMPLANT
INTRAOP CADWELL SUPPLY FEE NCS (MISCELLANEOUS) ×1
INTRAOP DISP SUPPLY FEE NCS (MISCELLANEOUS) ×2
INTRAOP MONITOR FEE IMPULS NCS (MISCELLANEOUS) ×1
INTRAOP MONITOR FEE IMPULSE (MISCELLANEOUS) ×1
KIT TURNOVER KIT A (KITS) ×2 IMPLANT
MANIFOLD NEPTUNE II (INSTRUMENTS) ×2 IMPLANT
MARKER SKIN DUAL TIP RULER LAB (MISCELLANEOUS) ×4 IMPLANT
NDL SAFETY ECLIPSE 18X1.5 (NEEDLE) ×1 IMPLANT
NEEDLE HYPO 18GX1.5 SHARP (NEEDLE) ×2
NEEDLE HYPO 22GX1.5 SAFETY (NEEDLE) ×2 IMPLANT
NS IRRIG 500ML POUR BTL (IV SOLUTION) ×1 IMPLANT
PACK LAMINECTOMY NEURO (CUSTOM PROCEDURE TRAY) ×2 IMPLANT
PAD ARMBOARD 7.5X6 YLW CONV (MISCELLANEOUS) ×4 IMPLANT
PIN CASPAR 14 (PIN) ×1 IMPLANT
PIN CASPAR 14MM (PIN) ×2 IMPLANT
PLATE ANT CERV XTEND 1 LV 12 (Plate) ×1 IMPLANT
PUTTY DBX 1CC (Putty) ×2 IMPLANT
PUTTY DBX 1CC DEPUY (Putty) IMPLANT
SCREW VAR 4.2 XD SELF DRILL 16 (Screw) ×4 IMPLANT
SPACER C HEDRON 12X14 7M 7D (Spacer) ×1 IMPLANT
SPONGE KITTNER 5P (MISCELLANEOUS) ×2 IMPLANT
STAPLER SKIN PROX 35W (STAPLE) IMPLANT
SURGIFLO W/THROMBIN 8M KIT (HEMOSTASIS) ×2 IMPLANT
SUT V-LOC 90 ABS DVC 3-0 CL (SUTURE) ×2 IMPLANT
SUT VIC AB 3-0 SH 8-18 (SUTURE) ×2 IMPLANT
SYR 30ML LL (SYRINGE) ×2 IMPLANT
TAPE CLOTH 3X10 WHT NS LF (GAUZE/BANDAGES/DRESSINGS) ×2 IMPLANT
TOWEL OR 17X26 4PK STRL BLUE (TOWEL DISPOSABLE) ×6 IMPLANT
TUBING CONNECTING 10 (TUBING) ×2 IMPLANT

## 2021-05-29 NOTE — Discharge Summary (Signed)
Physician Discharge Summary  Patient ID: Chad Sanders. MRN: [id number] DOB/AGE: 1950/06/12 71 y.o.  Admit date: 05/29/2021 Discharge date: 05/29/2021  Admission Diagnoses: cervical myelopathy  Discharge Diagnoses:  Active Problems:   * No active hospital problems. *   Discharged Condition: good  Hospital Course:  NICKOLUS WADDING is a 71 y.o s/p C3-4 ACDF for cervical myelopathy. His interoperative course was uncomplicated. He was monitored in PACU for 4 hours and discharged home after ambulating, urinating, and tolerating PO intake. He was given a prescription for Percocet and Robaxin.   Consults: None  Significant Diagnostic Studies: none  Treatments: surgery: as above. Please see separately dictated operative report for further details  Discharge Exam: Blood pressure 112/71, pulse 86, temperature 97.7 F (36.5 C), temperature source Oral, resp. rate 18, height 5' 8.5" (1.74 m), weight 78.5 kg, SpO2 98 %. CN II-XII grossly intacy  5/5 throughout except 4+ in bilateral IO and HG Incision c/d/I with Dermabond in place  Disposition: Discharge disposition: 01-Home or Self Care        Allergies as of 05/29/2021       Reactions   Ace Inhibitors Cough   Atorvastatin    Muscle pain   Contrast Media [iodinated Contrast Media] Itching   burning   Demerol [meperidine Hcl] Itching   Dilaudid [hydromorphone] Itching   Latex Itching   Morphine And Related Itching   Nsaids    Tinnitus   Silicone Itching   Tape Itching   Vytorin [ezetimibe-simvastatin]    Muscle pain        Medication List     STOP taking these medications    acetaminophen 500 MG tablet Commonly known as: TYLENOL   clopidogrel 75 MG tablet Commonly known as: PLAVIX       TAKE these medications    aspirin EC 81 MG tablet Take 81 mg by mouth daily. Swallow whole.   buPROPion 150 MG 24 hr tablet Commonly known as: WELLBUTRIN XL Take 150-300 mg by mouth See admin instructions. Take 300  mg in the morning and 150 mg in the evening   calcium carbonate 500 MG chewable tablet Commonly known as: TUMS - dosed in mg elemental calcium Chew 1 tablet by mouth daily as needed for indigestion or heartburn.   carvedilol 25 MG tablet Commonly known as: COREG Take 37.5 mg by mouth 2 (two) times daily with a meal.   Digestive Advantage Caps Take 2 capsules by mouth daily.   DIGESTIVE ADVANTAGE GUMMIES PO Take by mouth.   DIGESTIVE ADVANTAGE PO Take by mouth.   Doxepin HCl 6 MG Tabs Take 6 mg by mouth at bedtime.   empagliflozin 25 MG Tabs tablet Commonly known as: JARDIANCE Take by mouth daily.   felodipine 10 MG 24 hr tablet Commonly known as: PLENDIL Take 10 mg by mouth at bedtime.   furosemide 20 MG tablet Commonly known as: LASIX Take 20 mg by mouth 2 (two) times daily.   gabapentin 600 MG tablet Commonly known as: NEURONTIN Take 600 mg by mouth 2 (two) times daily.   hydrOXYzine 25 MG tablet Commonly known as: ATARAX Take 25 mg by mouth at bedtime as needed (sleep).   loperamide 2 MG tablet Commonly known as: IMODIUM A-D Take 2 mg by mouth 4 (four) times daily as needed for diarrhea or loose stools.   methocarbamol 500 MG tablet Commonly known as: Robaxin Take 1 tablet (500 mg total) by mouth every 8 (eight) hours as needed for muscle spasms.  mirabegron ER 50 MG Tb24 tablet Commonly known as: MYRBETRIQ Take 50 mg by mouth every morning.   MULTIVITAMIN MEN 50+ PO Take 1 capsule by mouth daily.   oxyCODONE-acetaminophen 5-325 MG tablet Commonly known as: Percocet Take 1 tablet by mouth every 4 (four) hours as needed for severe pain.   pantoprazole 40 MG tablet Commonly known as: PROTONIX Take 40 mg by mouth 2 (two) times daily.   potassium chloride SA 20 MEQ tablet Commonly known as: KLOR-CON M Take 20 mEq by mouth daily.   pravastatin 40 MG tablet Commonly known as: PRAVACHOL Take 40 mg by mouth at bedtime.   sitaGLIPtin 50 MG  tablet Commonly known as: JANUVIA Take 50 mg by mouth daily.   sodium fluoride 1.1 % Crea dental cream Commonly known as: PREVIDENT 5000 PLUS Place 1 application onto teeth in the morning and at bedtime.   tamsulosin 0.4 MG Caps capsule Commonly known as: FLOMAX Take 0.4 mg by mouth at bedtime.   telmisartan 80 MG tablet Commonly known as: MICARDIS Take 80 mg by mouth every morning.        Follow-up Information     Susanne Borders, PA Follow up on 06/13/2021.   Why: for post-op f/u and incision check. Contact information: [address] [place] [address] [phone]                 Signed: Susanne Borders 05/29/2021, 10:18 AM

## 2021-05-29 NOTE — Anesthesia Preprocedure Evaluation (Signed)
Anesthesia Evaluation  Patient identified by MRN, date of birth, ID band Patient awake    Reviewed: Allergy & Precautions, NPO status , Patient's Chart, lab work & pertinent test results  Airway Mallampati: II  TM Distance: >3 FB Neck ROM: Full    Dental  (+) Teeth Intact, Implants   Pulmonary neg pulmonary ROS, sleep apnea and Continuous Positive Airway Pressure Ventilation ,    Pulmonary exam normal breath sounds clear to auscultation       Cardiovascular Exercise Tolerance: Good hypertension, Pt. on medications + CAD and + Past MI  negative cardio ROS Normal cardiovascular exam Rhythm:Regular Rate:Normal     Neuro/Psych Petit mal negative neurological ROS  negative psych ROS   GI/Hepatic negative GI ROS, Neg liver ROS, GERD  ,  Endo/Other  negative endocrine ROSdiabetes, Well Controlled, Type 2  Renal/GU negative Renal ROS  negative genitourinary   Musculoskeletal  (+) Arthritis ,   Abdominal Normal abdominal exam  (+)   Peds negative pediatric ROS (+)  Hematology negative hematology ROS (+) Blood dyscrasia, anemia ,   Anesthesia Other Findings Past Medical History: No date: Anemia No date: Arthritis 10/26/2001: Coronary artery disease     Comment:  a.) STEMI 10/26/2001. LHC --> 75% mLCx, 25% mLAD, 50%               mLAD, 50% dLAD; 3.0 x 12 mm Exress stent to mLCx. b.) LHC              04/05/2002: 50% mLCx, 50% mLAD, 50% dLAD, 50% ISR of mLCX              stent; PCTA performed. c.) LHC 03/23/2003: 50% mLCx, 50%               pLAD, 50% mLAD; PTCA performed. d.) LHC 01/07/2007: 60%               mLAD; PCI placing a 2.75 x 20 mm Taxus DES to mLAD. No date: Erectile dysfunction No date: GERD (gastroesophageal reflux disease) 10/26/2001: H/O heart artery stent     Comment:  a.) 10/26/2001: 3.0 x 12 mm Express stent to mLCx. b.)               01/07/2007: 2.75 x 20 mm Taxus DES to mLAD. No date: High  cholesterol No date: History of lumbar fusion No date: Hypertension No date: Long term current use of antithrombotics/antiplatelets     Comment:  a.) DAPT therapy (ASA + clopidogrel) No date: OAB (overactive bladder) No date: PAD (peripheral artery disease) (HCC) No date: Prostate cancer (HCC)     Comment:  a.) Tx'd with chemotherapy + XRT No date: S/P gastric bypass No date: Sleep apnea     Comment:  a.) no nocturnal PAP therapy following post-surgical               (RNY) weight loss No date: Spinal stenosis 10/26/2001: ST elevation myocardial infarction (STEMI) of inferior  wall (HCC)     Comment:  a.) LHC 10/26/2001: EF 55%; 75% mLCx, 25% mLAD, 50%               mLAD, 50% dLAD; PCI performed placing a 3.0 x 12 mm               Extress stent to mLCx. 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  Past Surgical History: No date: APPENDECTOMY     Comment:  child 10/2018: BACK SURGERY  No date: CATARACT EXTRACTION; Bilateral 1982: CHOLECYSTECTOMY No date: COLONOSCOPY No date: CORONARY ANGIOPLASTY 04/05/2002: CORONARY ANGIOPLASTY; Left     Comment:  Procedure: CORONARY ANGIOPLASTY WITH PTCA; Location:               [facility]; Surgeon: Cephas Darby, MD 03/23/2003: CORONARY ANGIOPLASTY; Left     Comment:  Procedure: CORONARY ANGIOPLASTY WITH PTCA; Location:               [facility]; Surgeon: Cephas Darby, MD 10/26/2001: CORONARY ANGIOPLASTY WITH STENT PLACEMENT; Left     Comment:  Procedure: CORONARY ANGIOPLASTY WITH STENT PLACEMENT               (3.0 x 12 mm Express stent to mLCx); Location: [facility];               Surgeon: Jerrell Mylar, MD 01/07/2007: CORONARY ANGIOPLASTY WITH STENT PLACEMENT; Left     Comment:  Procedure: CORONARY ANGIOPLASTY WITH STENT PLACEMENT               (2.75 x 20 mm Taxus DES to mLAD); Location: [facility];               Surgeon: Rolly Salter, MD No date: ESOPHAGOGASTRODUODENOSCOPY No date: FEMORAL BYPASS; Left 1966: FRACTURE SURGERY;  Right     Comment:  side of skull No date: GASTRIC BYPASS No date: LUMBAR FUSION No date: LUMBAR LAMINECTOMY     Comment:  muliple lumbar surgeries No date: MULTIPLE TOOTH EXTRACTIONS No date: RETINAL DETACHMENT SURGERY; Right No date: THULIUM LASER TURP (TRANSURETHRAL RESECTION OF PROSTATE)     Reproductive/Obstetrics negative OB ROS                             Anesthesia Physical Anesthesia Plan  ASA: 3  Anesthesia Plan: General   Post-op Pain Management:    Induction: Intravenous  PONV Risk Score and Plan: Ondansetron, Dexamethasone, Midazolam and Treatment may vary due to age or medical condition  Airway Management Planned: Oral ETT  Additional Equipment:   Intra-op Plan:   Post-operative Plan: Extubation in OR  Informed Consent: I have reviewed the patients History and Physical, chart, labs and discussed the procedure including the risks, benefits and alternatives for the proposed anesthesia with the patient or authorized representative who has indicated his/her understanding and acceptance.     Dental Advisory Given  Plan Discussed with: CRNA and Surgeon  Anesthesia Plan Comments:         Anesthesia Quick Evaluation

## 2021-05-29 NOTE — Op Note (Addendum)
Indications: Chad Sanders Sr. 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 he failed conservative management, and elected to proceed with surgery.  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   Preoperative Note:   Risks of surgery discussed include: infection, bleeding, stroke, coma, death, paralysis, CSF leak, nerve/spinal cord injury, numbness, tingling, weakness, complex regional pain syndrome, recurrent stenosis and/or disc herniation, vascular injury, development of instability, neck/back pain, need for further surgery, persistent symptoms, development of deformity, and the risks of anesthesia. The patient understood these risks and agreed to proceed.  Operative Note:   Procedure:  1) Anterior cervical diskectomy and fusion at C3-4 2) Anterior cervical instrumentation at C3-4 using Globus Xtend 3) Insertion of biomechanical device at C3-4   Procedure: After obtaining informed consent, the patient taken to the operating room, placed in supine position, general anesthesia induced.  The patient had a small shoulder roll placed behind their shoulders.  The patient received preop antibiotics and IV Decadron.  The patient had a neck incision outlined, was prepped and draped in usual sterile fashion. The incision was injected with local anesthetic.   An incision was opened, dissection taken down medial to the carotid artery and jugular vein, lateral to the trachea and esophagus.  The prevertebral fascia was identified, and a localizing x-ray demonstrated the correct level.  The longus colli were dissected laterally, and self-retaining retractors placed to open the operative field. The microscope was then brought  into the field.  With this complete, distractor pins were placed in the vertebral bodies of C3 and C4. The distractor was placed, and the annulus at C3/4 was opened using a bovie.  Curettes and pituitary rongeurs used to remove the majority of disk, then the drill was used to remove the posterior osteophyte and begin the foraminotomies. The nerve hook was used to elevate the posterior longitudinal ligament, which was then removed with Kerrison rongeurs. Bilateral foraminotomies were performed. The microblunt nerve hook could be passed out the foramen bilaterally.   Meticulous hemostasis was obtained.  A biomechanical device (Globus Hedron C 7 mm height x 14 mm width by 12 mm depth) was placed at C3/4. The device had been filled with allograft for aid in arthrodesis.  The caspar distractor was removed, and bone wax used for hemostasis. A 12 mm Globus Xtend plate was chosen.  Two screws placed in each vertebral body, respectively making sure the screws were behind the locking mechanism.  Final AP and lateral radiographs were taken.   With everything in good position, the wound was irrigated copiously with bacitracin-containing solution and meticulous hemostasis obtained.  Wound was closed in 2 layers using interrupted inverted 3-0 Vicryl sutures.  The wound was dressed with dermabond, the head of bed at 30 degrees, taken to recovery room in stable condition.  No new postop neurological deficits were identified.  Sponge and pattie counts were correct at the end of the procedure.  Monitoring was stable throughout.  I performed the entire procedure with the assistance of Cooper Render PA as an Pensions consultant.  Meade Maw MD

## 2021-05-29 NOTE — Progress Notes (Signed)
Pharmacy Antibiotic Note  Chad Sanders. is a 71 y.o. male admitted on (Not on file) with surgical prophylaxis.  Pharmacy has been consulted for Cefazolin dosing.  Plan: TBW = 78.5 kg  Cefazolin 2 gm IV X 1 60 min pre-op ordered for 2/15 @ 0500     No data recorded.  No results for input(s): WBC, CREATININE, LATICACIDVEN, VANCOTROUGH, VANCOPEAK, VANCORANDOM, GENTTROUGH, GENTPEAK, GENTRANDOM, TOBRATROUGH, TOBRAPEAK, TOBRARND, AMIKACINPEAK, AMIKACINTROU, AMIKACIN in the last 168 hours.  CrCl cannot be calculated (Patient's most recent lab result is older than the maximum 21 days allowed.).    Allergies  Allergen Reactions   Ace Inhibitors Cough   Atorvastatin     Muscle pain   Contrast Media [Iodinated Contrast Media] Itching    burning   Demerol [Meperidine Hcl] Itching   Dilaudid [Hydromorphone] Itching   Latex Itching   Morphine And Related Itching   Nsaids     Tinnitus   Silicone Itching   Tape Itching   Vytorin [Ezetimibe-Simvastatin]     Muscle pain    Antimicrobials this admission:   >>    >>   Dose adjustments this admission:   Microbiology results:  BCx:   UCx:    Sputum:    MRSA PCR:   Thank you for allowing pharmacy to be a part of this patients care.  Annemarie Sebree D 05/29/2021 1:53 AM

## 2021-05-29 NOTE — Discharge Instructions (Addendum)
Your surgeon has performed an operation on your cervical spine (neck) to relieve pressure on the spinal cord and/or nerves. This involved making an incision in the front of your neck and removing one or more of the discs that support your spine. Next, a small piece of bone, a titanium plate, and screws were used to fuse two or more of the vertebrae (bones) together.  The following are instructions to help in your recovery once you have been discharged from the hospital. Even if you feel well, it is important that you follow these activity guidelines. If you do not let your neck heal properly from the surgery, you can increase the chance of return of your symptoms and other complications.  * Do not take anti-inflammatory medications for 3 months after surgery (naproxen [Aleve], ibuprofen [Advil, Motrin], celecoxib [Celebrex], etc.). These medications can prevent your bones from healing properly.  Activity    No bending, lifting, or twisting ("BLT"). Avoid lifting objects heavier than 10 pounds (gallon milk jug).  Where possible, avoid household activities that involve lifting, bending, reaching, pushing, or pulling such as laundry, vacuuming, grocery shopping, and childcare. Try to arrange for help from friends and family for these activities while your back heals.  Increase physical activity slowly as tolerated.  Taking short walks is encouraged, but avoid strenuous exercise. Do not jog, run, bicycle, lift weights, or participate in any other exercises unless specifically allowed by your doctor.  Talk to your doctor before resuming sexual activity.  You should not drive until cleared by your doctor.  Until released by your doctor, you should not return to work or school.  You should rest at home and let your body heal.   You may shower three days after your surgery.  After showering, lightly dab your incision dry. Do not take a tub bath or go swimming until approved by your doctor at your  follow-up appointment.  If your doctor ordered a cervical collar (neck brace) for you, you should wear it whenever you are out of bed. You may remove it when lying down or sleeping, but you should wear it at all other times. Not all neck surgeries require a cervical collar.  If you smoke, we strongly recommend that you quit.  Smoking has been proven to interfere with normal bone healing and will dramatically reduce the success rate of your surgery. Please contact [facility] (800-QUIT-NOW) and use the resources at www.[facility].com for assistance in stopping smoking.  Surgical Incision   Keep your incision area clean and dry.  Your incision was closed with Dermabond glue. The glue should begin to peel away within about a week.  Diet           You may return to your usual diet. However, you may experience discomfort when swallowing in the first month after your surgery. This is normal. You may find that softer foods are more comfortable for you to swallow. Be sure to stay hydrated.  When to Contact Us  You may experience pain in your neck and/or pain between your shoulder blades. This is normal and should improve in the next few weeks with the help of pain medication, muscle relaxers, and rest. Some patients report that a warm compress on the back of the neck or between the shoulder blades helps.  However, should you experience any of the following, contact us immediately: New numbness or weakness Pain that is progressively getting worse, and is not relieved by your pain medication, muscle relaxers, rest, and   warm compresses Bleeding, redness, swelling, pain, or drainage from surgical incision Chills or flu-like symptoms Fever greater than 101.0 F (38.3 C) Inability to eat, drink fluids, or take medications Problems with bowel or bladder functions Difficulty breathing or shortness of breath Warmth, tenderness, or swelling in your calf Contact Information During office hours  (Monday-Friday 9 am to 5 pm), please call your physician at [phone] and ask for Kendelyn Jean After hours and weekends, please call [phone] and speak with the answering service, who will contact the doctor on call.  If that fails, call the [facility] Operator at [phone] and ask for the Neurosurgery Resident On Call  For a life-threatening emergency, call 911   AMBULATORY SURGERY  DISCHARGE INSTRUCTIONS   The drugs that you were given will stay in your system until tomorrow so for the next 24 hours you should not:  Drive an automobile Make any legal decisions Drink any alcoholic beverage   You may resume regular meals tomorrow.  Today it is better to start with liquids and gradually work up to solid foods.  You may eat anything you prefer, but it is better to start with liquids, then soup and crackers, and gradually work up to solid foods.   Please notify your doctor immediately if you have any unusual bleeding, trouble breathing, redness and pain at the surgery site, drainage, fever, or pain not relieved by medication.    Additional Instructions:        Please contact your physician with any problems or Same Day Surgery at [phone], Monday through Friday 6 am to 4 pm, or [place] [facility] [place] [facility] number at [phone].    

## 2021-05-29 NOTE — Transfer of Care (Signed)
Immediate Anesthesia Transfer of Care Note  Patient: Chad Sanders  Procedure(s) Performed: C3-4 ANTERIOR CERVICAL DECOMPRESSION/DISCECTOMY FUSION 1 LEVEL (Spine Cervical)  Patient Location: PACU  Anesthesia Type:General  Level of Consciousness: drowsy  Airway & Oxygen Therapy: Patient connected to face mask oxygen  Post-op Assessment: Report given to RN and Post -op Vital signs reviewed and stable  Post vital signs: Reviewed and stable  Last Vitals:  Vitals Value Taken Time  BP 117/74 05/29/21 1031  Temp 36.2 C 05/29/21 1031  Pulse 72 05/29/21 1038  Resp 13 05/29/21 1038  SpO2 100 % 05/29/21 1038  Vitals shown include unvalidated device data.  Last Pain:  Vitals:   05/29/21 0735  TempSrc: Oral  PainSc: 3          Complications: No notable events documented.

## 2021-05-29 NOTE — Anesthesia Procedure Notes (Signed)
Procedure Name: Intubation Date/Time: 05/29/2021 8:43 AM Performed by: Morene Crocker, CRNA Pre-anesthesia Checklist: Patient identified, Emergency Drugs available, Suction available and Patient being monitored Patient Re-evaluated:Patient Re-evaluated prior to induction Oxygen Delivery Method: Circle system utilized Preoxygenation: Pre-oxygenation with 100% oxygen Induction Type: IV induction Ventilation: Mask ventilation without difficulty Laryngoscope Size: McGraph and 3 Grade View: Grade I Tube type: Oral Tube size: 7.0 mm Number of attempts: 1 Airway Equipment and Method: Stylet Placement Confirmation: ETT inserted through vocal cords under direct vision, positive ETCO2 and breath sounds checked- equal and bilateral Secured at: 22 cm Tube secured with: Tape Dental Injury: Teeth and Oropharynx as per pre-operative assessment

## 2021-05-29 NOTE — H&P (Signed)
History of Present Illness: 05/29/2021 Chad Sanders is a 71 yo male with symptoms below.  He presents today for surgery.  03/28/2021 Chad Sanders is here today with a chief complaint of hand tingling and weakness in his hands. He has arm pain when he lays on his side. He is also suffering from balance issues and has had multiple falls. He has back pain in the middle of his back. His leg has been weak over the past 2 years. He reports that he has some urinary urgency and incontinence since his last surgery.  The symptoms have been worsening over the past year.  Has been told he needs left knee surgery and left elbow surgery. Has a torn rotator cuff in right shoulder.  On plavix - hx of heart attack 2002 and stents  Conservative measures:  Physical therapy: has participated, most recently in 2020 Multimodal medical therapy including regular antiinflammatories: gabapentin, Butrans patch, tylenol, prednisone Injections: has received epidural steroid injections 05/17/18: bilateral sacroiliac joint injection at [facility] 05/07/17: Left L1-2 interlaminar epidural 06/06/14: Bilateral L3-L4 transforaminal injection  Past Surgery: L4 PSO, T10-Pelvis rex/ext PSF by Dr Laren Everts on 10/19/2018, extension of posterior fusion to L3, L2-3 TLIF by Dr Laren Everts on 06/23/17, 3 surgeries prior to that by Dr Fabio Neighbors at [place] and (doesn't recall surgeon's name)   Chad Sanders Sr. has symptoms of cervical myelopathy.  The symptoms are causing a significant impact on the patient's life.   HPI from Narda Rutherford, [place] on 02/28/21: Chad Sanders Sr. is a 71 y.o. male who presents with the chief complaint of left leg pain, imbalance and bilateral hand weakness. He is a little more than 2 years status post L4 PSO and T10 to pelvis posterior spinal fusion. He tells me today that recently he has had pain in the anterior aspect of his left leg. He has also been stumbling a lot due to imbalance. He states that  both of his hand tingle and are weak. He also endorses urinary and fecal incontinence as well as low back pain. He has had the symptoms for several months.  Review of Systems:  A 10 point review of systems is negative, except for the pertinent positives and negatives detailed in the HPI.  Past Medical History: Past Medical History:  Diagnosis Date   Anemia   Arthritis  shoulder   BPH (benign prostatic hyperplasia)   Chronic pain   Coronary artery disease   Depression   Gastroesophageal reflux disease without esophagitis   Heart disease   Hyperlipidemia Mid nineties   Hypertension   Irritable bowel syndrome  controls with lopromide   Low back pain   Myocardial infarction (CMS-HCC) 2003   Osteoarthritis About 5 years ago   Overactive bladder 04/22/2013   Prostate cancer (CMS-HCC)  Gleason 3+4=7   Sleep apnea Mid nineties  Resolved via weight loss   Type II diabetes mellitus (CMS-HCC) 04/01/2012   Past Surgical History: Past Surgical History:  Procedure Laterality Date   APPENDECTOMY   ARTHRODESIS POSTERIOR LUMBAR SPINE W/LAMINECTOMY/DISCECTOMY Bilateral 10/18/2013  Procedure: L4-S1 POSTERIOR SPINAL FUSION WITH TLIFs EACH LEVEL; Surgeon: Almon Register, MD; Location: [facility]; Service: Neurosurgery; Laterality: Bilateral;   ARTHRODESIS POSTERIOR LUMBAR SPINE W/LAMINECTOMY/DISCECTOMY Bilateral 10/18/2013  Procedure: ARTHRODESIS INTERBODY TECHNIQUE POSTERIOR LUMBAR SPINE W/LAMINECTOMY/DISCECTOMY; Surgeon: Almon Register, MD; Location: [facility]; Service: Neurosurgery; Laterality: Bilateral;   ARTHRODESIS POSTERIOR LUMBAR SPINE W/LAMINECTOMY/DISCECTOMY Bilateral 06/23/2017  Procedure: EXT OF POSTERIOR FUSION TO L2, L2/3  TLIF; Surgeon: Almon Register, MD; Location: [facility]; Service: Neurosurgery; Laterality: Bilateral;   AUTOGRAFT OBTAINED SAME INCISION FOR SPINE SURGERY Bilateral 10/18/2013  Procedure: AUTOGRAFT OBTAINED SAME INCISION FOR SPINE  SURGERY; Surgeon: Almon Register, MD; Location: [facility]; Service: Neurosurgery; Laterality: Bilateral;   AUTOGRAFT OBTAINED SAME INCISION FOR SPINE SURGERY Bilateral 09/22/2014  Procedure: AUTOGRAFT OBTAINED SAME INCISION FOR SPINE SURGERY; Surgeon: Almon Register, MD; Location: [facility]; Service: Neurosurgery; Laterality: Bilateral;   AUTOGRAFT OBTAINED SAME INCISION FOR SPINE SURGERY Bilateral 06/23/2017  Procedure: AUTOGRAFT FOR SPINE SURGERY ONLY (INCL HARVESTING GRAFT); LOCAL (EG, RIBS, SPINOUS PROCESS, OR LAMINAR FRAGMENT) OBTAINED FROM SAME INCISION (LIST IN ADDITION TO PRIMARY PROCEDURE); Surgeon: Almon Register, MD; Location: [facility]; Service: Neurosurgery; Laterality: Bilateral;   AUTOGRAFT OBTAINED SAME INCISION FOR SPINE SURGERY Bilateral 10/19/2018  Procedure: AUTOGRAFT FOR SPINE SURGERY ONLY (INCL HARVESTING GRAFT); LOCAL (EG, RIBS, SPINOUS PROCESS, OR LAMINAR FRAGMENT) OBTAINED FROM SAME INCISION (LIST IN ADDITION TO PRIMARY PROCEDURE); Surgeon: Almon Register, MD; Location: [facility]; Service: Neurosurgery; Laterality: Bilateral;   BACK SURGERY   BARIATRIC SURGERY   cardiac stents  x 3   CHOLECYSTECTOMY   CHOLECYSTECTOMY 1986   CORONARY ANGIOPLASTY   DECOMPRESSION LUMBAR SPINE BY TRANSPEDICULAR APPROACH 03/2013  at [place]   EGD 05/2017   EXTRACTION CATARACT EXTRACAPSULAR W/INSERTION INTRAOCULAR PROSTHESIS Right 04/26/2013  Procedure: EXTRACTION CATARACT --R--EXTRACAPSULAR W/INSERTION INTRAOCULAR PROSTHESIS; Surgeon: Doyce Para, MD; Location: [facility]; Service: Ophthalmology; Laterality: Right;   EXTRACTION TEETH 10/05/2013   EYE SURGERY   EYE TRAUMA Right childhood  patient got hit in the os by baseball while playing   [place] ARTERY BYPASS GRAFT 2010   FRACTURE SURGERY 1966   FUSION LATERAL INTERBODY DLIF/XLIF Bilateral 09/22/2014  Procedure: L3/4 XLIF WITH LATERAL PLATE; Surgeon: Almon Register, MD;  Location: [facility]; Service: Neurosurgery; Laterality: Bilateral;   gastric bypass 2012 2012   INSERTION MORSELIZED BONE ALLOGRAFT FOR SPINE SURGERY Bilateral 10/18/2013  Procedure: INSERTION MORSELIZED BONE ALLOGRAFT FOR SPINE SURGERY; Surgeon: Almon Register, MD; Location: [facility]; Service: Neurosurgery; Laterality: Bilateral;   INSERTION MORSELIZED BONE ALLOGRAFT FOR SPINE SURGERY Bilateral 09/22/2014  Procedure: INSERTION MORSELIZED BONE ALLOGRAFT FOR SPINE SURGERY; Surgeon: Almon Register, MD; Location: [facility]; Service: Neurosurgery; Laterality: Bilateral;   INSERTION MORSELIZED BONE ALLOGRAFT FOR SPINE SURGERY Bilateral 06/23/2017  Procedure: ALLOGRAFT, MORSELIZED, OR PLACEMENT OF OSTEOPROMOTIVE MATERIAL, FOR SPINE SURGERY ONLY (LIST IN ADDITION TO PRIMARY PROCEDURE); Surgeon: Almon Register, MD; Location: [facility]; Service: Neurosurgery; Laterality: Bilateral;   INSERTION MORSELIZED BONE ALLOGRAFT FOR SPINE SURGERY Bilateral 10/19/2018  Procedure: ALLOGRAFT, MORSELIZED, OR PLACEMENT OF OSTEOPROMOTIVE MATERIAL, FOR SPINE SURGERY ONLY (LIST IN ADDITION TO PRIMARY PROCEDURE); Surgeon: Almon Register, MD; Location: [facility]; Service: Neurosurgery; Laterality: Bilateral;   INSTRUMENTATION POSTERIOR SPINE 1/2 VERTEBRAL SEGMENTS W/O FIXATION Bilateral 09/22/2014  Procedure: INSTRUMENTATION POSTERIOR SPINE 1/2 VERTEBRAL SEGMENTS W/O FIXATION; Surgeon: Almon Register, MD; Location: [facility]; Service: Neurosurgery; Laterality: Bilateral;   INSTRUMENTATION POSTERIOR SPINE 13/MORE VERTEBRAL SEGMENTS Bilateral 10/19/2018  Procedure: POSTERIOR SEGMENTAL INSTRUMENTATION (EG, PEDICLE FIXATION, DUAL RODS WITH MULTIPLE HOOKS AND SUBLAMINAR WIRES); 7 TO 12 VERTEBRAL SEGMENTS (LIST IN ADDITION TO PRIMARY PROCEDURE); Surgeon: Almon Register, MD; Location: [facility]; Service: Neurosurgery; Laterality: Bilateral;   INSTRUMENTATION  POSTERIOR SPINE 3 TO 6 VERTEBRAL SEGMENTS Bilateral 10/18/2013  Procedure: L4-S1 POSTERIOR SPINAL FUSION WITH TLIFs EACH LEVEL; Surgeon: Almon Register, MD; Location:  [facility]; Service: Neurosurgery; Laterality: Bilateral;   LAMINECTOMY LUMBAR SPINE 2009  L4/5 (at [place])   left leg arteriogram with graft angioplasty   LENS EYE SURGERY Left 09/09/11  COMPLEX CATARACT REMOVAL   PERCUTANEOUS SPINAL FUSION Bilateral 10/19/2018  Procedure: ARTHRODESIS, POSTERIOR OR POSTEROLATERAL TECHNIQUE, SINGLE LEVEL; LUMBAR (WITH LATERAL TRANSVERSE TECHNIQUE, WHEN PERFORMED); Surgeon: Almon Register, MD; Location: [facility]; Service: Neurosurgery; Laterality: Bilateral;   polypectomy   POSTERIOR LUMBAR SPINE FUSION ONE LEVEL N/A 09/24/2014  Procedure: POSTERIOR LUMBAR SPINE FUSION ONE LEVEL; Surgeon: Almon Register, MD; Location: [facility]; Service: Neurosurgery; Laterality: N/A;   POSTERIOR LUMBAR SPINE FUSION ONE LEVEL LATERAL TRANSVERSE TECHNIQUE Bilateral 10/19/2018  Procedure: **AIRO #**L4 PSO, T10-PELVIS REV/EXT POSTERIOR SPINAL FUSION; Surgeon: Almon Register, MD; Location: [facility]; Service: Neurosurgery; Laterality: Bilateral;   POSTEROLATERAL OSTEOTOMY LUMBAR SPINE Bilateral 10/19/2018  Procedure: OSTEOTOMY OF SPINE, POSTERIOR OR POSTEROLATERAL APPROACH, 3 COLUMNS, 1 VERTEBRAL SEGMENT (EG, PEDICLE/VERTEBRAL BODY SUBTRACTION); LUMBAR; Surgeon: Almon Register, MD; Location: [facility]; Service: Neurosurgery; Laterality: Bilateral;   TURP VAPORIZATION 2016   VITREOUS RETINAL SURGERY Right 02/11/11  OS SB/PPV/PFC/FAX/EL/14% c22f for mac off RRD    Allergies  Allergen Reactions   Ace Inhibitors Cough   Atorvastatin     Muscle pain   Contrast Media [Iodinated Contrast Media] Itching    burning   Demerol [Meperidine Hcl] Itching   Dilaudid [Hydromorphone] Itching   Latex Itching   Morphine And Related Itching   Nsaids     Tinnitus   Silicone  Itching   Tape Itching   Vytorin [Ezetimibe-Simvastatin]     Muscle pain     Current Meds  Medication Sig   acetaminophen (TYLENOL) 500 MG tablet Take 1,000 mg by mouth every 8 (eight) hours as needed for moderate pain.   buPROPion (WELLBUTRIN XL) 150 MG 24 hr tablet Take 150-300 mg by mouth See admin instructions. Take 300 mg in the morning and 150 mg in the evening   calcium carbonate (TUMS - DOSED IN MG ELEMENTAL CALCIUM) 500 MG chewable tablet Chew 1 tablet by mouth daily as needed for indigestion or heartburn.   carvedilol (COREG) 25 MG tablet Take 37.5 mg by mouth 2 (two) times daily with a meal.   Doxepin HCl 6 MG TABS Take 6 mg by mouth at bedtime.   felodipine (PLENDIL) 10 MG 24 hr tablet Take 10 mg by mouth at bedtime.   furosemide (LASIX) 20 MG tablet Take 20 mg by mouth 2 (two) times daily.   gabapentin (NEURONTIN) 600 MG tablet Take 600 mg by mouth 2 (two) times daily.   hydrOXYzine (ATARAX) 25 MG tablet Take 25 mg by mouth at bedtime as needed (sleep).   loperamide (IMODIUM A-D) 2 MG tablet Take 2 mg by mouth 4 (four) times daily as needed for diarrhea or loose stools.   mirabegron ER (MYRBETRIQ) 50 MG TB24 tablet Take 50 mg by mouth every morning.   Multiple Vitamins-Minerals (MULTIVITAMIN MEN 50+ PO) Take 1 capsule by mouth daily.   pantoprazole (PROTONIX) 40 MG tablet Take 40 mg by mouth 2 (two) times daily.   potassium chloride SA (KLOR-CON M) 20 MEQ tablet Take 20 mEq by mouth daily.   pravastatin (PRAVACHOL) 40 MG tablet Take 40 mg by mouth at bedtime.   Probiotic Product (DIGESTIVE ADVANTAGE GUMMIES PO) Take by mouth.   Probiotic Product (DIGESTIVE ADVANTAGE PO) Take by mouth.   Probiotic Product (DIGESTIVE ADVANTAGE) CAPS Take 2 capsules  by mouth daily.   sitaGLIPtin (JANUVIA) 50 MG tablet Take 50 mg by mouth daily.   sodium fluoride (PREVIDENT 5000 PLUS) 1.1 % CREA dental cream Place 1 application onto teeth in the morning and at bedtime.   tamsulosin (FLOMAX) 0.4  MG CAPS capsule Take 0.4 mg by mouth at bedtime.   telmisartan (MICARDIS) 80 MG tablet Take 80 mg by mouth every morning.      Social History: Social History   Tobacco Use   Smoking status: Never   Smokeless tobacco: Never  Vaping Use   Vaping Use: Never used  Substance Use Topics   Alcohol use: Not Currently  Alcohol/week: 0.0 standard drinks  Comment: Always social drinker, glass of wine   Drug use: No   Family Medical History: Family History  Problem Relation Age of Onset   Diabetes Mother   Heart failure Mother   Dementia Mother   Stroke Mother   Diabetes Father   Heart failure Father   Myocardial Infarction (Heart attack) Father 31   Stroke Father   Cataracts Brother   Heart failure Brother   COPD Brother  Deceased   Asthma Brother   Heart failure Brother 84   Coronary Artery Disease (Blocked arteries around heart) Brother 42  PCI   Myocardial Infarction (Heart attack) Brother 33   COPD Brother   Diabetes type II Brother   Heart disease Brother   Prostate cancer Brother   High blood pressure (Hypertension) Brother   Malignant hyperthermia Neg Hx   Glaucoma Neg Hx   Macular degeneration Neg Hx   Kidney cancer Neg Hx   Malignant hypertension Neg Hx   Anesthesia problems Neg Hx   Pseudochol deficiency Neg Hx   Physical Examination:  Vitals:   05/29/21 0735  BP: 112/71  Pulse: 86  Resp: 18  Temp: 97.7 F (36.5 C)  SpO2: 98%   Heart sounds normal no MRG. Chest Clear to Auscultation Bilaterally.   General: Patient is well developed, well nourished, calm, collected, and in no apparent distress. Attention to examination is appropriate.  Psychiatric: Patient is non-anxious.  Head: Pupils equal, round, and reactive to light.  ENT: Oral mucosa appears well hydrated.  Neck: Supple. Limited range of motion.  Respiratory: Patient is breathing without any difficulty.  Extremities: No edema.  Vascular: Palpable dorsal pedal pulses.  Skin: On  exposed skin, there are no abnormal skin lesions.  NEUROLOGICAL:   Awake, alert, oriented to person, place, and time. Speech is clear and fluent. Fund of knowledge is appropriate.   Cranial Nerves: Pupils equal round and reactive to light. Facial tone is symmetric. Facial sensation is symmetric. Shoulder shrug is symmetric. Tongue protrusion is midline. There is no pronator drift.  Strength: Side Biceps Triceps Deltoid Interossei Grip Wrist Ext. Wrist Flex.  R _0 4+ _1 L _2 4+ _3 Side Iliopsoas Quads Hamstring PF DF EHL  R _4 L _5 Reflexes are 1+ and symmetric at the biceps, triceps, brachioradialis, patella and achilles. Hoffman's is present.  Clonus is not present. Toes are down-going.  Bilateral upper and lower extremity sensation is intact to light touch.  Gait is wide-based.  Moderate difficulty with tandem gait.  No evidence of dysmetria noted.  Medical Decision Making  Imaging: MRI Total Spine 03/25/21 IMPRESSION:  1. Disc bulge, facet arthropathy and ligamentum flavum buckling at T8-T9 results  in severe spinal canal stenosis and severe bilateral neuroforaminal narrowing. 2. Spondylosis of the cervical spine most pronounced at C3-C4 where there is uncovertebral hypertrophy and small central disc protrusion resulting in moderate spinal canal stenosis.       Electronically Reviewed by: Margretta Sidle, MD, [facility] Electronically Reviewed on: 03/26/2021 2:14 PM   I have reviewed the images and concur with the above findings.   Electronically Signed by: Georga Bora, MD, [facility] Electronically Signed on: 03/26/2021 2:15 PM  I have personally reviewed the images and agree with the above interpretation.  Assessment and Plan: Mr. Taney is a pleasant 71 y.o. male with cervical myelopathy, thoracic stenosis, thoracic pain, and history of thoracolumbar fusion. He has acquired scoliosis.  He has worsening symptoms of  cervical myelopathy and has anterolisthesis of the cervical spine at C3-4. There is no role for conservative management for cervical myelopathy. We will proceed with  C3-4 anterior cervical discectomy and fusion.   Meade Maw MD, [facility] Department of Neurosurgery

## 2021-05-30 NOTE — Anesthesia Postprocedure Evaluation (Signed)
Anesthesia Post Note  Patient: Chad Costain Sr.  Procedure(s) Performed: C3-4 ANTERIOR CERVICAL DECOMPRESSION/DISCECTOMY FUSION 1 LEVEL (Spine Cervical)  Patient location during evaluation: PACU Anesthesia Type: General Level of consciousness: awake and awake and alert Pain management: satisfactory to patient Vital Signs Assessment: post-procedure vital signs reviewed and stable Respiratory status: spontaneous breathing and nonlabored ventilation Cardiovascular status: stable Anesthetic complications: no   No notable events documented.   Last Vitals:  Vitals:   05/29/21 1315 05/29/21 1425  BP: 134/82 125/70  Pulse: 92 91  Resp: 17 16  Temp:  (!) 36.2 C  SpO2: 100% 99%    Last Pain:  Vitals:   05/29/21 1425  TempSrc: Temporal  PainSc: 3                  VAN STAVEREN,Katelinn Justice

## 2023-02-14 IMAGING — RF DG CERVICAL SPINE 2 OR 3 VIEWS
1 series · 3 of 3 positions shown · non-contrast
Comparison: None.

CLINICAL DATA: ACDF.

EXAM:
CERVICAL SPINE - 2-3 VIEW

[Series 1: dg x-ray · 0.20mm/px · 3 of 3 slices shown]
[im 1/3]
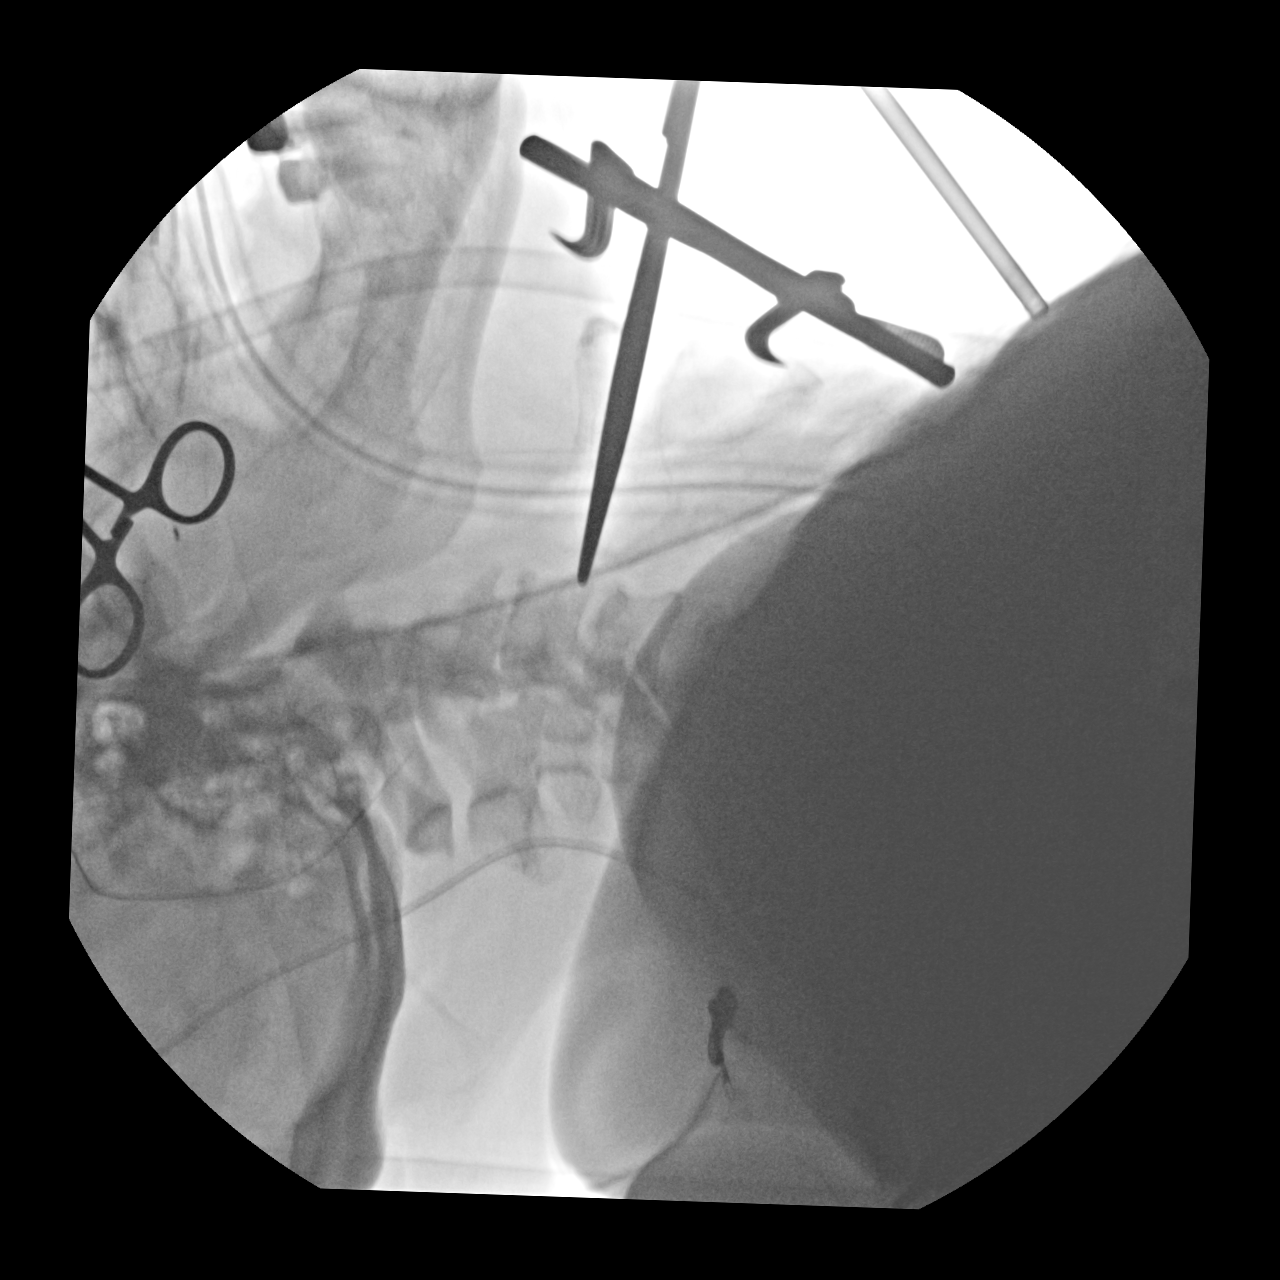
[im 2/3]
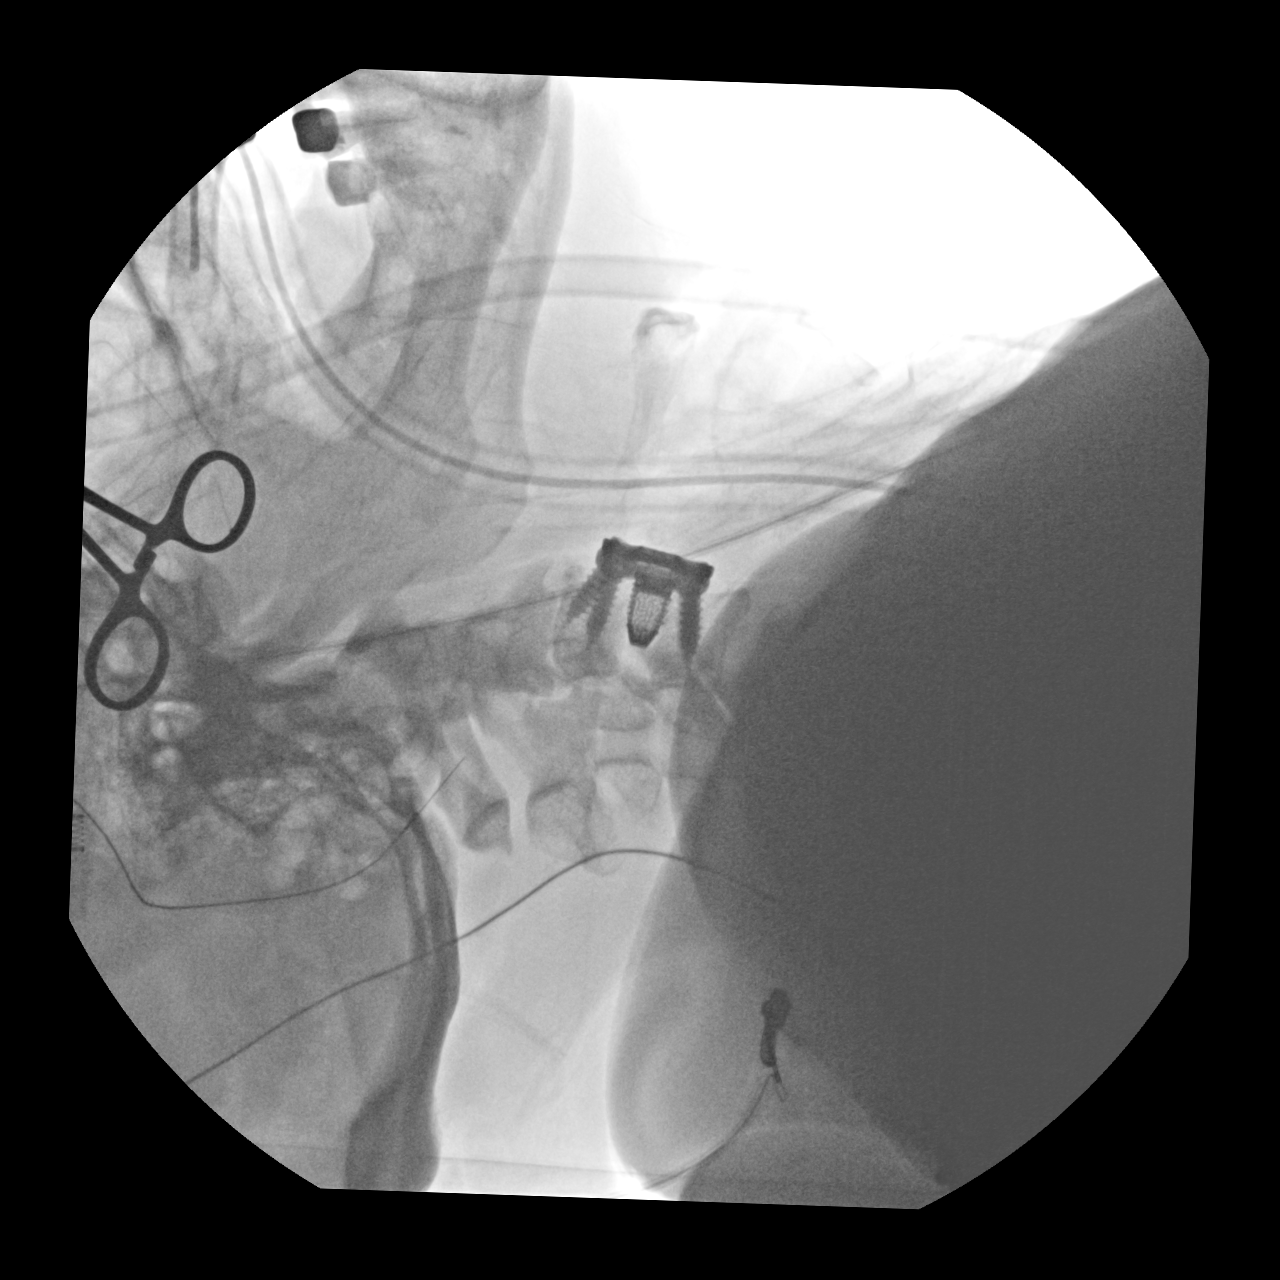
[im 3/3]
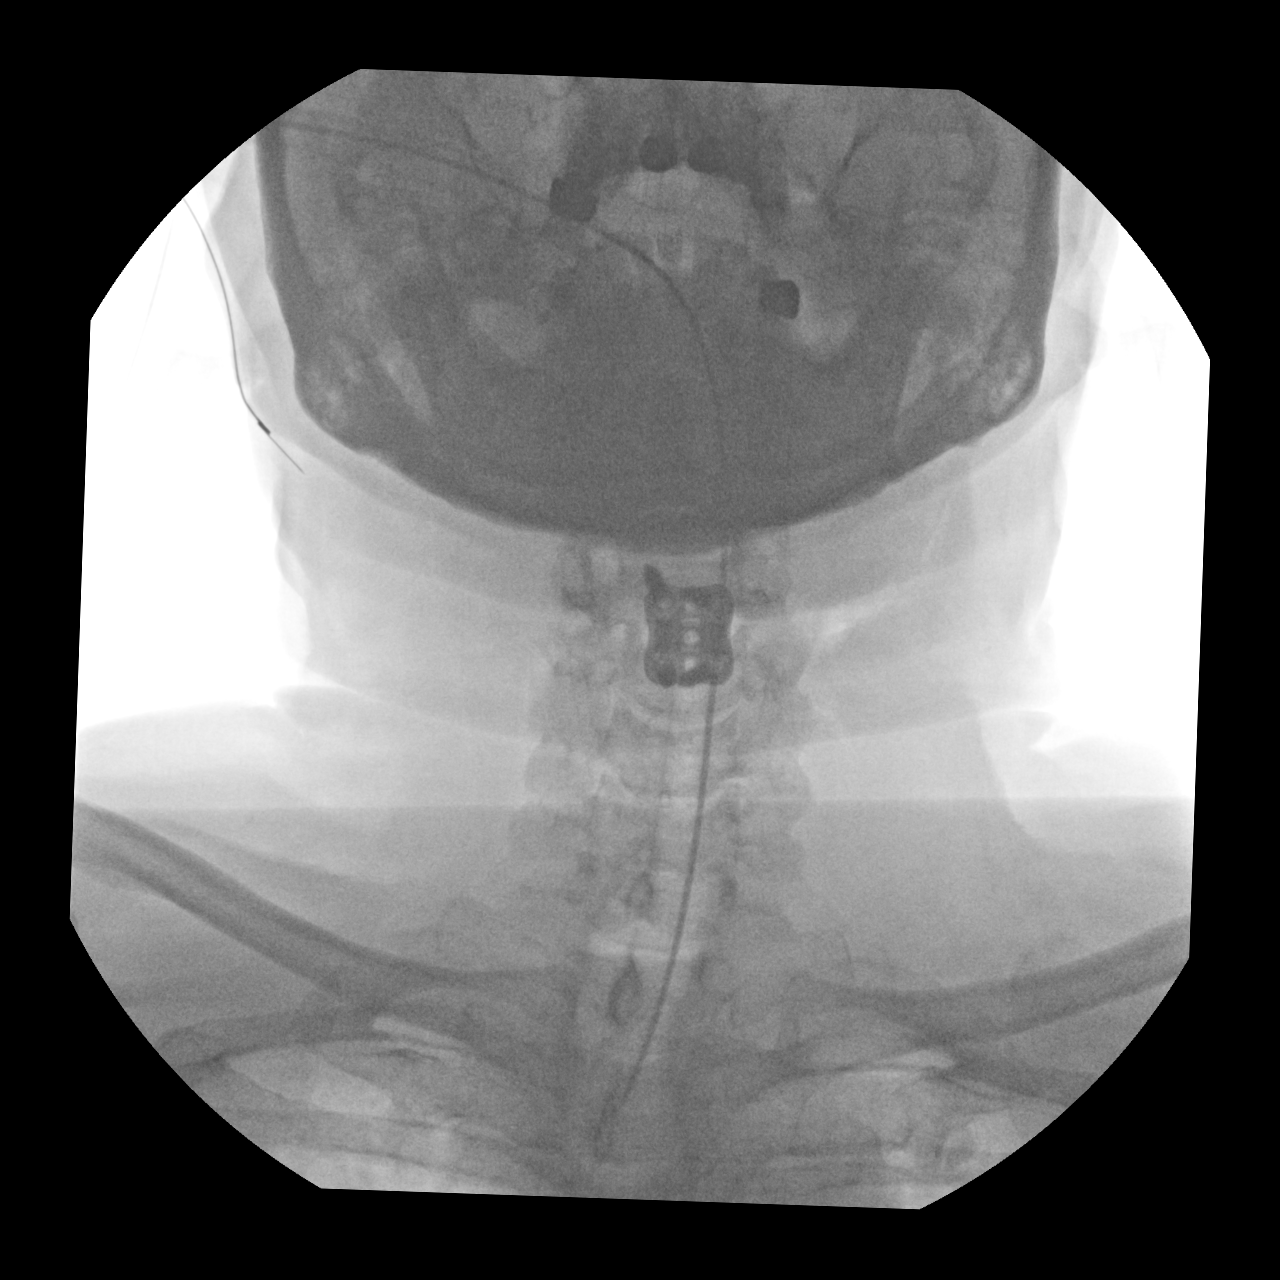

[3 of 3 positions shown; findings below may reference images not displayed]

FLUOROSCOPY TIME:  Radiation Exposure Index (as provided by the
fluoroscopic device): 0.18 mGy Kerma

C-arm fluoroscopic images were obtained intraoperatively and
submitted for post operative interpretation.
FINDINGS: Intraoperative fluoroscopic images demonstrate interval C3-C4 ACDF.
Severe disc height loss at C2-C3.
IMPRESSION: Intraoperative fluoroscopic guidance for C3-C4 ACDF.
